# Patient Record
Sex: Female | Born: 1980 | Race: White | Hispanic: Yes | Marital: Married | Smoking: Never smoker
Health system: Southern US, Community
[De-identification: ages and names within clinical notes are randomized; demographics above are authoritative.]

## PROBLEM LIST (undated history)

## (undated) HISTORY — PX: WISDOM TOOTH EXTRACTION: SHX21

---

## 2006-06-17 ENCOUNTER — Other Ambulatory Visit: Admission: RE | Admit: 2006-06-17 | Discharge: 2006-06-17 | Payer: Self-pay | Admitting: Emergency Medicine

## 2007-06-22 ENCOUNTER — Other Ambulatory Visit: Admission: RE | Admit: 2007-06-22 | Discharge: 2007-06-22 | Payer: Self-pay | Admitting: Emergency Medicine

## 2009-05-01 ENCOUNTER — Other Ambulatory Visit: Admission: RE | Admit: 2009-05-01 | Discharge: 2009-05-01 | Payer: Self-pay | Admitting: Family Medicine

## 2010-06-04 ENCOUNTER — Other Ambulatory Visit: Admission: RE | Admit: 2010-06-04 | Discharge: 2010-06-04 | Payer: Self-pay | Admitting: Family Medicine

## 2013-03-10 ENCOUNTER — Other Ambulatory Visit (HOSPITAL_COMMUNITY)
Admission: RE | Admit: 2013-03-10 | Discharge: 2013-03-10 | Disposition: A | Discharge status: Home / Self Care | Payer: BC Managed Care – PPO | Source: Ambulatory Visit | Attending: Family Medicine | Admitting: Family Medicine

## 2013-03-10 ENCOUNTER — Other Ambulatory Visit: Payer: Self-pay | Admitting: Family Medicine

## 2013-03-10 DIAGNOSIS — Z1151 Encounter for screening for human papillomavirus (HPV): Secondary | ICD-10-CM | POA: Insufficient documentation

## 2013-03-10 DIAGNOSIS — Z124 Encounter for screening for malignant neoplasm of cervix: Secondary | ICD-10-CM | POA: Insufficient documentation

## 2014-03-23 ENCOUNTER — Other Ambulatory Visit: Payer: Self-pay | Admitting: Family Medicine

## 2014-03-23 DIAGNOSIS — N6310 Unspecified lump in the right breast, unspecified quadrant: Secondary | ICD-10-CM

## 2014-03-30 ENCOUNTER — Other Ambulatory Visit: Payer: BC Managed Care – PPO

## 2014-04-25 ENCOUNTER — Ambulatory Visit
Admission: RE | Admit: 2014-04-25 | Discharge: 2014-04-25 | Disposition: A | Discharge status: Home / Self Care | Payer: BC Managed Care – PPO | Source: Ambulatory Visit | Attending: Family Medicine | Admitting: Family Medicine

## 2014-04-25 DIAGNOSIS — N6310 Unspecified lump in the right breast, unspecified quadrant: Secondary | ICD-10-CM

## 2014-05-04 ENCOUNTER — Other Ambulatory Visit: Payer: BC Managed Care – PPO

## 2014-05-12 ENCOUNTER — Other Ambulatory Visit: Payer: BC Managed Care – PPO

## 2014-05-18 ENCOUNTER — Ambulatory Visit: Admission: RE | Admit: 2014-05-18 | Payer: BC Managed Care – PPO | Source: Ambulatory Visit

## 2014-05-18 ENCOUNTER — Ambulatory Visit
Admission: RE | Admit: 2014-05-18 | Discharge: 2014-05-18 | Disposition: A | Discharge status: Home / Self Care | Payer: BC Managed Care – PPO | Source: Ambulatory Visit | Attending: Family Medicine | Admitting: Family Medicine

## 2014-05-18 ENCOUNTER — Other Ambulatory Visit: Payer: Self-pay | Admitting: Family Medicine

## 2014-05-18 ENCOUNTER — Encounter (INDEPENDENT_AMBULATORY_CARE_PROVIDER_SITE_OTHER): Payer: Self-pay

## 2014-05-18 DIAGNOSIS — N6310 Unspecified lump in the right breast, unspecified quadrant: Secondary | ICD-10-CM

## 2014-07-30 ENCOUNTER — Emergency Department (HOSPITAL_COMMUNITY): Payer: BC Managed Care – PPO

## 2014-07-30 ENCOUNTER — Encounter (HOSPITAL_COMMUNITY): Payer: Self-pay | Admitting: Emergency Medicine

## 2014-07-30 ENCOUNTER — Emergency Department (HOSPITAL_COMMUNITY)
Admission: EM | Admit: 2014-07-30 | Discharge: 2014-07-30 | Disposition: A | Discharge status: Home / Self Care | Payer: BC Managed Care – PPO | Attending: Emergency Medicine | Admitting: Emergency Medicine

## 2014-07-30 DIAGNOSIS — Y998 Other external cause status: Secondary | ICD-10-CM | POA: Diagnosis not present

## 2014-07-30 DIAGNOSIS — Z23 Encounter for immunization: Secondary | ICD-10-CM | POA: Insufficient documentation

## 2014-07-30 DIAGNOSIS — Y9389 Activity, other specified: Secondary | ICD-10-CM | POA: Insufficient documentation

## 2014-07-30 DIAGNOSIS — S8992XA Unspecified injury of left lower leg, initial encounter: Secondary | ICD-10-CM | POA: Diagnosis present

## 2014-07-30 DIAGNOSIS — W208XXA Other cause of strike by thrown, projected or falling object, initial encounter: Secondary | ICD-10-CM | POA: Diagnosis not present

## 2014-07-30 DIAGNOSIS — Y9289 Other specified places as the place of occurrence of the external cause: Secondary | ICD-10-CM | POA: Insufficient documentation

## 2014-07-30 DIAGNOSIS — T148XXA Other injury of unspecified body region, initial encounter: Secondary | ICD-10-CM

## 2014-07-30 DIAGNOSIS — S8012XA Contusion of left lower leg, initial encounter: Secondary | ICD-10-CM | POA: Insufficient documentation

## 2014-07-30 MED ORDER — TETANUS-DIPHTH-ACELL PERTUSSIS 5-2.5-18.5 LF-MCG/0.5 IM SUSP
0.5000 mL | Freq: Once | INTRAMUSCULAR | Status: AC
  Administered 2014-07-30: 0.5 mL via INTRAMUSCULAR
  Filled 2014-07-30: qty 0.5

## 2014-07-30 MED ORDER — HYDROCODONE-ACETAMINOPHEN 5-325 MG PO TABS
2.0000 | ORAL_TABLET | Freq: Once | ORAL | Status: AC
  Administered 2014-07-30: 2 via ORAL
  Filled 2014-07-30: qty 2

## 2014-07-30 MED ORDER — HYDROCODONE-ACETAMINOPHEN 5-325 MG PO TABS: 1.0000 | ORAL_TABLET | Freq: Four times a day (QID) | ORAL | Status: DC | PRN

## 2014-07-30 NOTE — Discharge Instructions (Signed)

## 2014-07-30 NOTE — ED Notes (Signed)
Pt c/o L leg injury after a trash can full of cement fell on her leg. Pt's leg was stuck under trash can for ten minutes. No bleeding. Pt has not attempted to walk on leg. A&Ox4.

## 2014-07-30 NOTE — ED Provider Notes (Signed)
CSN: [id number]     Arrival date & time 07/30/14  1712 History  This chart was scribed for non-physician practitioner, Madison LowerVrinda Jakyla Reza, FNP,working with Enid SkeensJoshua M Zavitz, MD, by Karle PlumberJennifer Tensley, ED Scribe. This patient was seen in room WTR7/WTR7 and the patient's care was started at 5:26 PM.  Chief Complaint  Patient presents with  . Leg Injury   The history is provided by the patient. No language interpreter was used.    HPI Comments:  Madison Platendrea Spaventa Daniels is a 33 y.o. female who presents to the Emergency Department complaining of a left leg injury that occurred when a trash can full of cement fell on the leg about 1.5 hours ago. She reports associated severe pain. Bearing weight makes the pain worse. Denies alleviating factors. Unsure of last tetanus vaccination. LMP started earlier today. Denies head injury, LOC, numbness, tingling or weakness of the LLE.   History reviewed. No pertinent past medical history. History reviewed. No pertinent past surgical history. No family history on file. History  Substance Use Topics  . Smoking status: Never Smoker   . Smokeless tobacco: Not on file  . Alcohol Use: Yes   OB History    No data available     Review of Systems  Musculoskeletal: Positive for arthralgias.  Skin: Positive for color change and wound.  Neurological: Negative for syncope, weakness and numbness.  All other systems reviewed and are negative.   Allergies  Review of patient's allergies indicates not on file.  Home Medications   Prior to Admission medications   Not on File   Triage Vitals: BP 125/64 mmHg  Pulse 68  Temp(Src) 97.8 F (36.6 C) (Oral)  Resp 16  SpO2 100%  LMP 07/30/2014 (LMP Unknown) Physical Exam  Constitutional: She is oriented to person, place, and time. She appears well-developed and well-nourished.  HENT:  Head: Normocephalic and atraumatic.  Eyes: EOM are normal.  Neck: Normal range of motion.  Cardiovascular: Normal rate and intact  distal pulses.   Pulmonary/Chest: Effort normal.  Musculoskeletal: Normal range of motion.  Neurological: She is alert and oriented to person, place, and time.  Skin: Skin is warm and dry.  Deep abrasion to the left shin. Pt has full rom. Ambulatory without any problem. Pulses intact. Bruising noted to the area  Psychiatric: She has a normal mood and affect. Her behavior is normal.  Nursing note and vitals reviewed.   ED Course  Procedures (including critical care time) DIAGNOSTIC STUDIES: Oxygen Saturation is 100% on RA, normal by my interpretation.   COORDINATION OF CARE: 5:29 PM- Will X-Ray LLE and update tetanus. Will order pain medication. Pt verbalizes understanding and agrees to plan.  Medications - No data to display  Labs Review Labs Reviewed - No data to display  Imaging Review Dg Tibia/fibula Left  07/30/2014   CLINICAL DATA:  Midshaft tibial pain and bruising/abrasion status post trauma.  EXAM: LEFT TIBIA AND FIBULA - 2 VIEW  COMPARISON:  None.  FINDINGS: No displaced fracture. No aggressive osseous lesion. No radiopaque foreign body.  IMPRESSION: Negative.   Electronically Signed   By: Jearld LeschAndrew  DelGaizo M.D.   On: 07/30/2014 17:57     EKG Interpretation None      MDM   Final diagnoses:  Abrasion  Contusion    No bony abnormality noted. Pt is neurovascularly intact. Discussed sign and symptoms of compartment syndrome with pt and return precautions. Pt verbalized understanding  I personally performed the services described in this documentation, which  was scribed in my presence. The recorded information has been reviewed and is accurate.    Madison LowerVrinda Bri Wakeman, NP 07/30/14 1818  Enid SkeensJoshua M Zavitz, MD 07/30/14 2244

## 2015-01-05 ENCOUNTER — Encounter (HOSPITAL_COMMUNITY): Payer: Self-pay | Admitting: General Practice

## 2015-01-06 NOTE — H&P (Signed)
Madison Daniels is an 34 y.o. female with a missed abortion at 7 weeks.  Patient was counseled for expectant, medical and surgical management.  She elected to proceed with surgical management.  The patient has no medical concerns.  Pertinent Gynecological History: Menses: pregnant Bleeding: none Contraception: none DES exposure: unknown Blood transfusions: none Sexually transmitted diseases: no past history Previous GYN Procedures: none  Last mammogram: n/a Date: n/a Last pap: normal Date: 2014 OB History: G1, P0   Menstrual History: Menarche age: n/a Patient's last menstrual period was 11/05/2014.    History reviewed. No pertinent past medical history.  Past Surgical History  Procedure Laterality Date  . Wisdom tooth extraction Bilateral     History reviewed. No pertinent family history.  Social History:  reports that she has never smoked. She has never used smokeless tobacco. She reports that she drinks alcohol. Her drug history is not on file.  Allergies: No Known Allergies  No prescriptions prior to admission    ROS  Height 5\' 3"  (1.6 m), weight 135 lb (61.236 kg), last menstrual period 11/05/2014. Physical Exam  Constitutional: She is oriented to person, place, and time. She appears well-developed and well-nourished.  GI: Soft. There is no rebound and no guarding.  Neurological: She is alert and oriented to person, place, and time.  Skin: Skin is warm and dry.  Psychiatric: She has a normal mood and affect. Her behavior is normal.    No results found for this or any previous visit (from the past 24 hour(s)).  No results found.  Assessment/Plan: 33yo G1 with missed ab at 7 weeks -Check blood type -Patient is counseled for D&C including risk of bleeding, infection, scarring, and damage to surrounding structures.  All questions were answered and the patient wishes to proceed.  Latravis Grine 01/06/2015, 8:16 PM

## 2015-01-10 ENCOUNTER — Encounter (HOSPITAL_COMMUNITY): Payer: Self-pay | Admitting: Anesthesiology

## 2015-01-10 NOTE — Anesthesia Preprocedure Evaluation (Deleted)
Anesthesia Evaluation  Patient identified by MRN, date of birth, ID band Patient awake    Airway        Dental   Pulmonary neg pulmonary ROS,          Cardiovascular negative cardio ROS      Neuro/Psych negative neurological ROS     GI/Hepatic negative GI ROS, Neg liver ROS,   Endo/Other  negative endocrine ROS  Renal/GU negative Renal ROS     Musculoskeletal   Abdominal   Peds  Hematology   Anesthesia Other Findings   Reproductive/Obstetrics Missed AB                             Anesthesia Physical Anesthesia Plan  ASA: I  Anesthesia Plan: MAC   Post-op Pain Management:    Induction: Intravenous  Airway Management Planned: Nasal Cannula  Additional Equipment:   Intra-op Plan:   Post-operative Plan:   Informed Consent: I have reviewed the patients History and Physical, chart, labs and discussed the procedure including the risks, benefits and alternatives for the proposed anesthesia with the patient or authorized representative who has indicated his/her understanding and acceptance.     Plan Discussed with:   Anesthesia Plan Comments:         Anesthesia Quick Evaluation

## 2015-01-12 ENCOUNTER — Ambulatory Visit (HOSPITAL_COMMUNITY)
Admission: RE | Admit: 2015-01-12 | Payer: BLUE CROSS/BLUE SHIELD | Source: Ambulatory Visit | Admitting: Obstetrics & Gynecology

## 2015-01-12 SURGERY — DILATION AND EVACUATION, UTERUS
Anesthesia: Choice

## 2016-04-30 IMAGING — CR DG TIBIA/FIBULA 2V*L*
2 series · 2 of 2 positions shown · non-contrast
Comparison: None.

CLINICAL DATA: Midshaft tibial pain and bruising/abrasion status
post trauma.

EXAM:
LEFT TIBIA AND FIBULA - 2 VIEW

[x tib-fib lat left (1 of 2)]
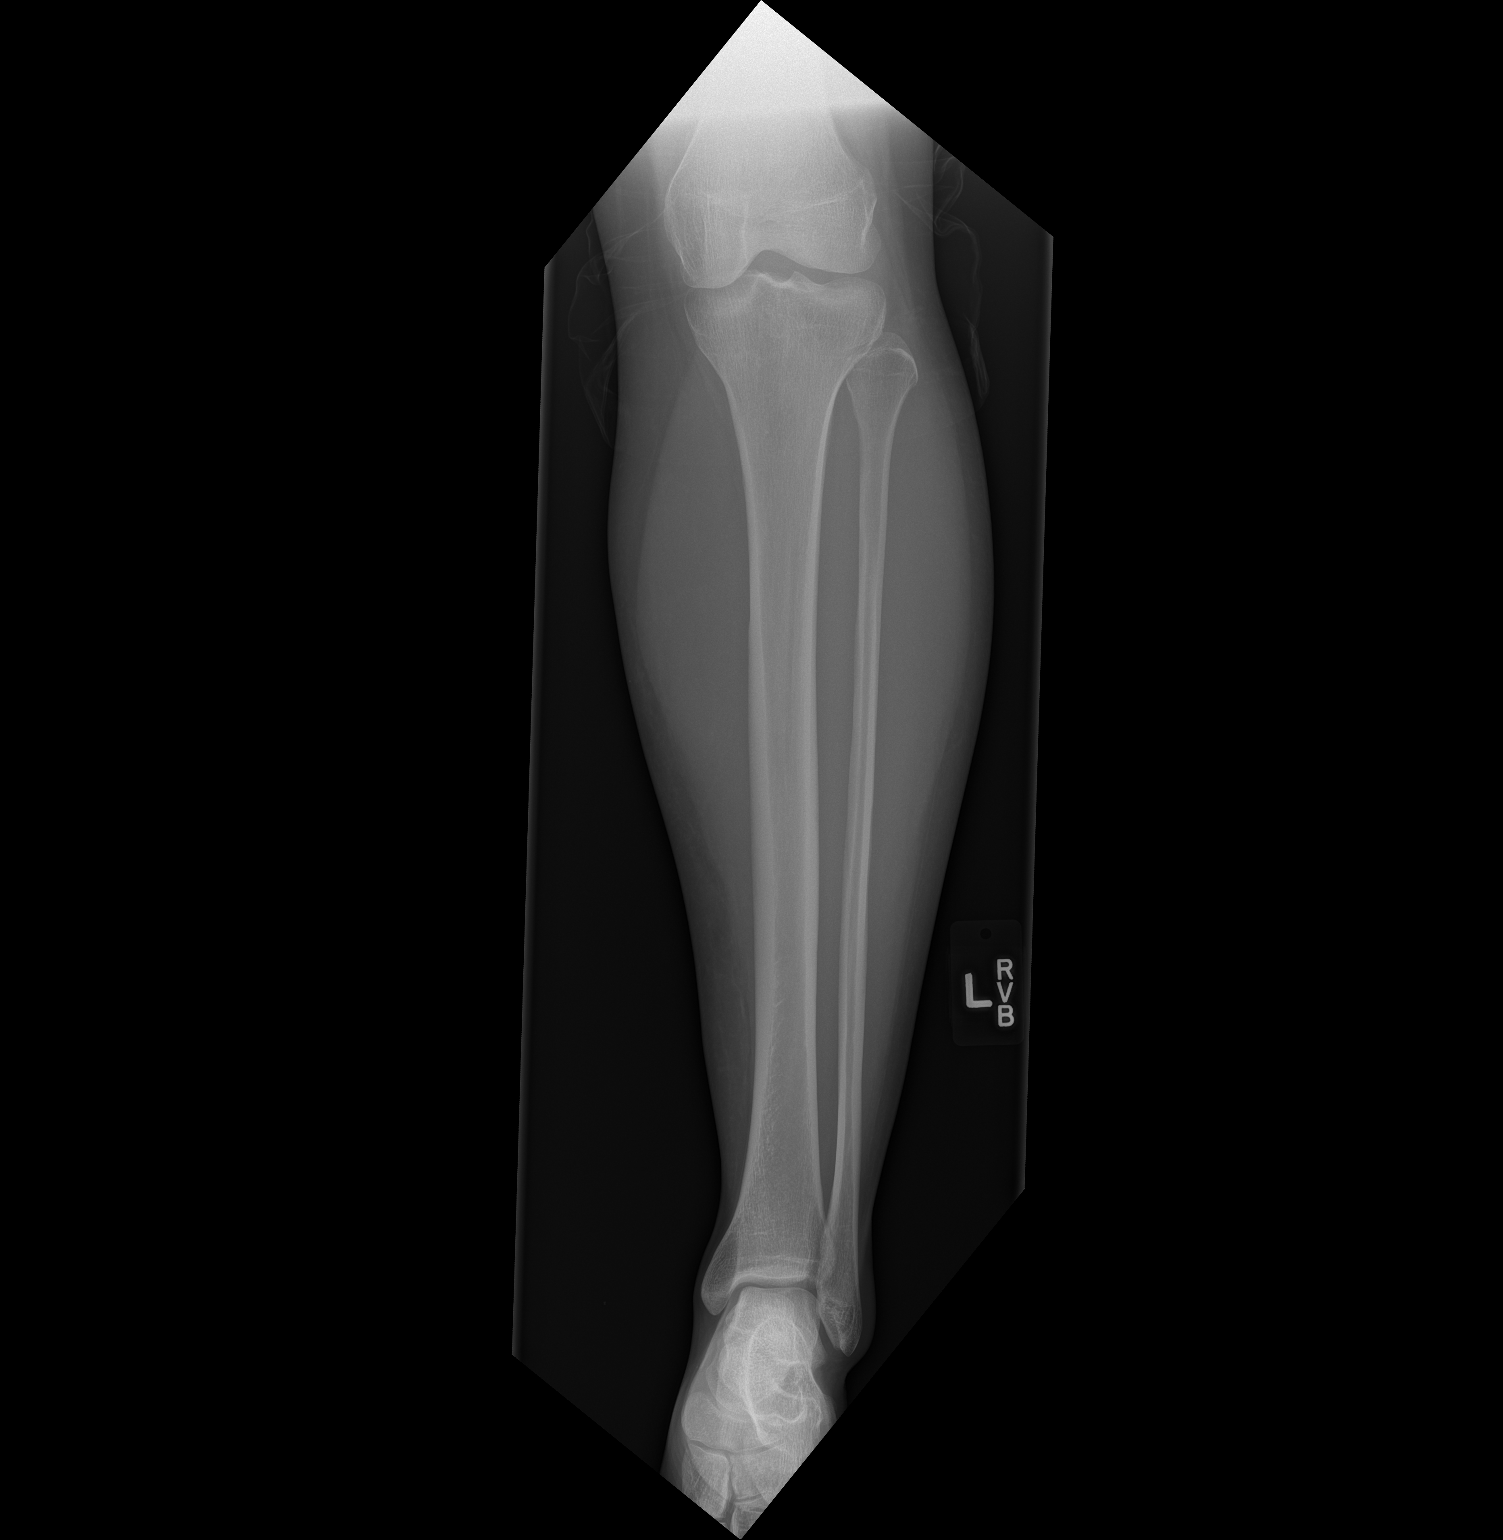

[x tib-fib lat left (2 of 2)]
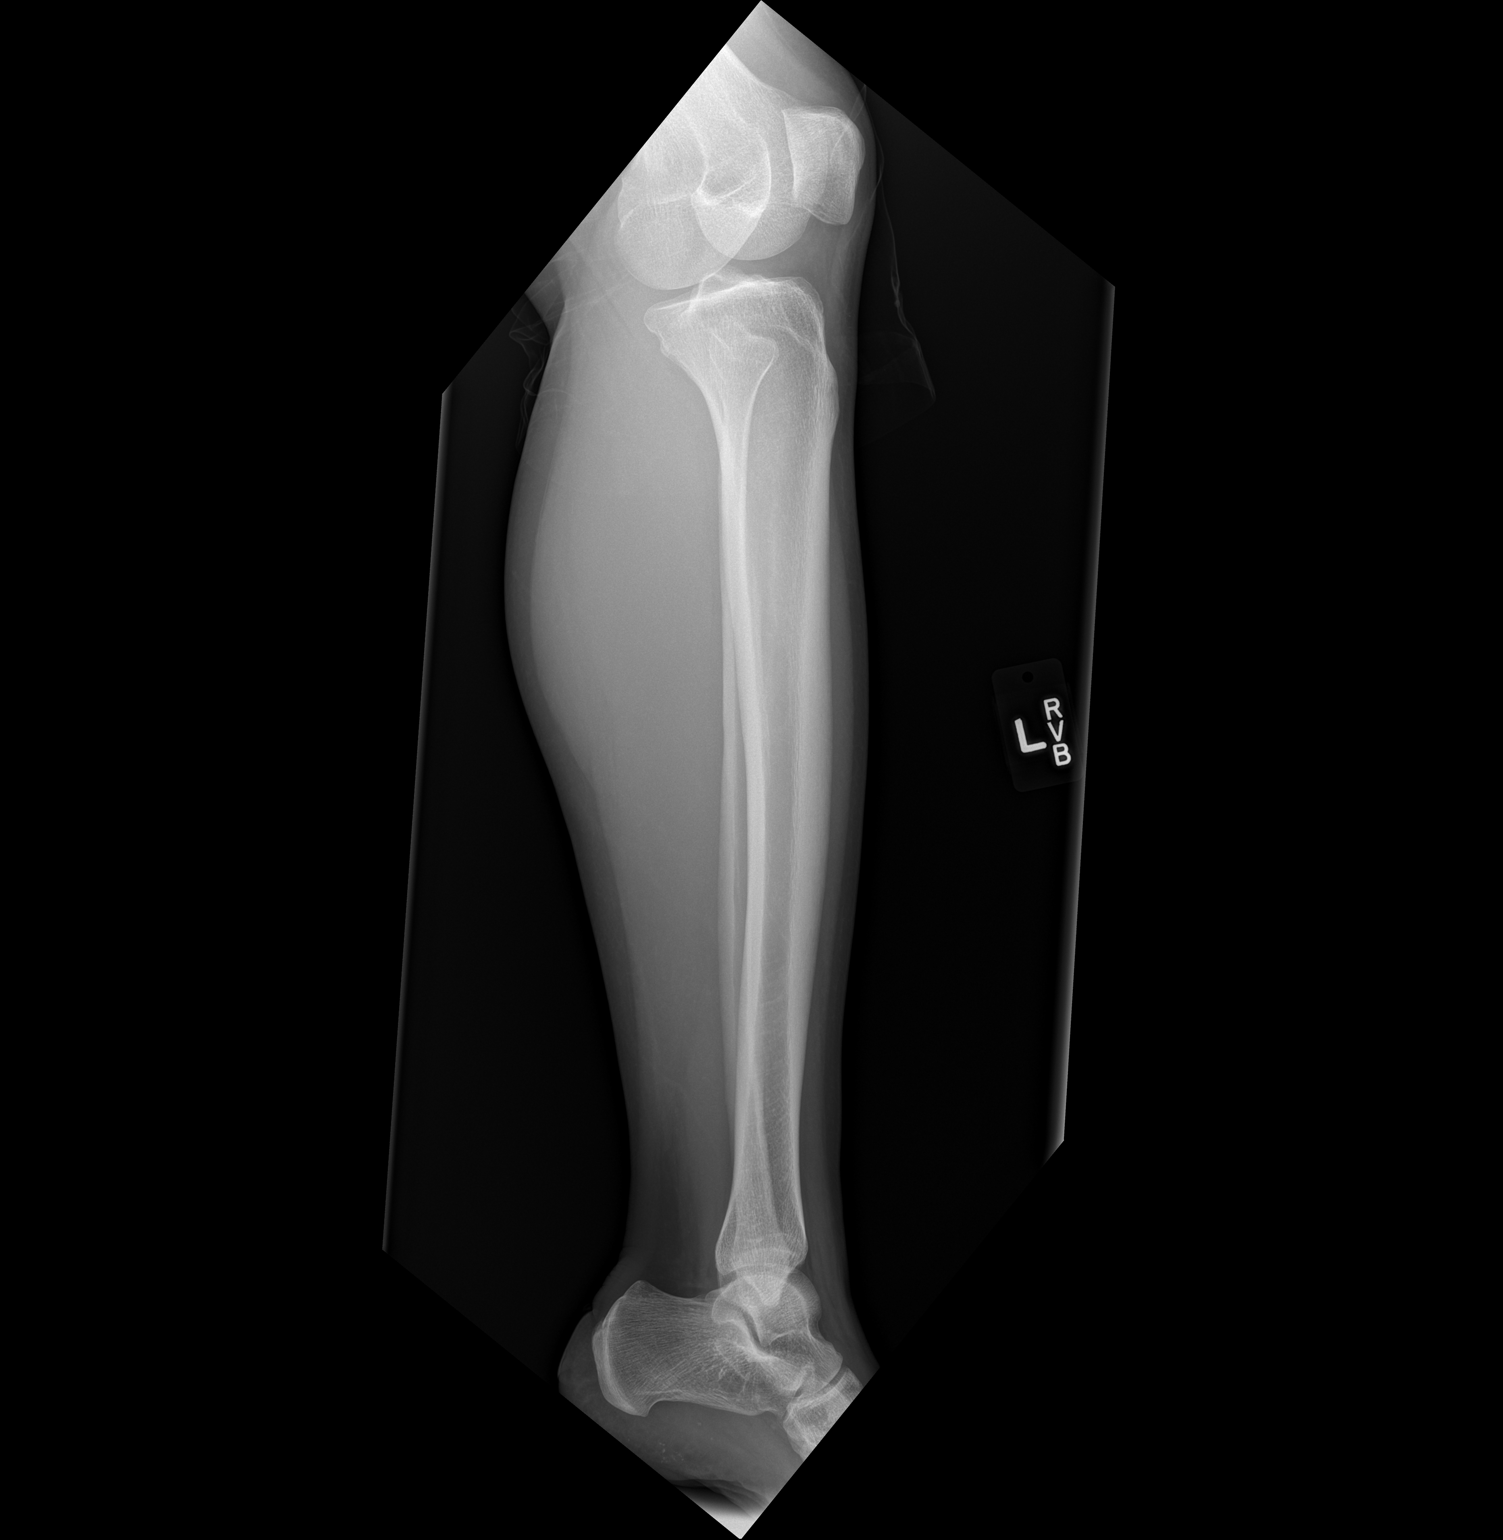

[2 of 2 positions shown; findings below may reference images not displayed]

FINDINGS: No displaced fracture. No aggressive osseous lesion. No radiopaque
foreign body.
IMPRESSION: Negative.

## 2016-12-19 DIAGNOSIS — N912 Amenorrhea, unspecified: Secondary | ICD-10-CM | POA: Diagnosis not present

## 2016-12-19 DIAGNOSIS — N911 Secondary amenorrhea: Secondary | ICD-10-CM | POA: Diagnosis not present

## 2016-12-25 DIAGNOSIS — N912 Amenorrhea, unspecified: Secondary | ICD-10-CM | POA: Diagnosis not present

## 2016-12-25 DIAGNOSIS — N911 Secondary amenorrhea: Secondary | ICD-10-CM | POA: Diagnosis not present

## 2017-01-01 DIAGNOSIS — N911 Secondary amenorrhea: Secondary | ICD-10-CM | POA: Diagnosis not present

## 2017-02-04 DIAGNOSIS — N96 Recurrent pregnancy loss: Secondary | ICD-10-CM | POA: Diagnosis not present

## 2017-09-08 NOTE — L&D Delivery Note (Signed)
Delivery Note At 5:11 PM a viable female was delivered via Vaginal, Spontaneous (Presentation:OA ;  ).  APGAR: , ; weight  .   Placenta statusSPONT>>INTACT: , .  Cord:  with the following complications: .  Cord pH: NOT SENT  Anesthesia:  EPID Episiotomy:NONE  Lacerations:  L PERIURETH +SEC DEG Suture Repair: 3.0 vicryl rapide Est. Blood Loss (mL):  90  Mom to postpartum.  Baby to Couplet care / Skin to Skin.  Madison Daniels Milana ObeyM Luis Sami 07/21/2018, 5:34 PM

## 2017-11-19 DIAGNOSIS — Z3141 Encounter for fertility testing: Secondary | ICD-10-CM | POA: Diagnosis not present

## 2017-12-15 DIAGNOSIS — Z3200 Encounter for pregnancy test, result unknown: Secondary | ICD-10-CM | POA: Diagnosis not present

## 2017-12-17 DIAGNOSIS — Z3200 Encounter for pregnancy test, result unknown: Secondary | ICD-10-CM | POA: Diagnosis not present

## 2017-12-30 DIAGNOSIS — O0901 Supervision of pregnancy with history of infertility, first trimester: Secondary | ICD-10-CM | POA: Diagnosis not present

## 2017-12-30 DIAGNOSIS — Z3A00 Weeks of gestation of pregnancy not specified: Secondary | ICD-10-CM | POA: Diagnosis not present

## 2018-01-12 DIAGNOSIS — Z3685 Encounter for antenatal screening for Streptococcus B: Secondary | ICD-10-CM | POA: Diagnosis not present

## 2018-01-12 DIAGNOSIS — Z13228 Encounter for screening for other metabolic disorders: Secondary | ICD-10-CM | POA: Diagnosis not present

## 2018-01-12 DIAGNOSIS — Z3401 Encounter for supervision of normal first pregnancy, first trimester: Secondary | ICD-10-CM | POA: Diagnosis not present

## 2018-01-12 LAB — OB RESULTS CONSOLE HIV ANTIBODY (ROUTINE TESTING): HIV: NONREACTIVE

## 2018-01-12 LAB — OB RESULTS CONSOLE GC/CHLAMYDIA
Chlamydia: NEGATIVE
GC PROBE AMP, GENITAL: NEGATIVE

## 2018-01-12 LAB — OB RESULTS CONSOLE HEPATITIS B SURFACE ANTIGEN: HEP B S AG: NEGATIVE

## 2018-01-12 LAB — OB RESULTS CONSOLE RUBELLA ANTIBODY, IGM: RUBELLA: IMMUNE

## 2018-01-12 LAB — OB RESULTS CONSOLE RPR: RPR: NONREACTIVE

## 2018-01-19 DIAGNOSIS — Z3491 Encounter for supervision of normal pregnancy, unspecified, first trimester: Secondary | ICD-10-CM | POA: Diagnosis not present

## 2018-01-19 DIAGNOSIS — Z331 Pregnant state, incidental: Secondary | ICD-10-CM | POA: Diagnosis not present

## 2018-01-19 DIAGNOSIS — Z3480 Encounter for supervision of other normal pregnancy, unspecified trimester: Secondary | ICD-10-CM | POA: Diagnosis not present

## 2018-01-19 DIAGNOSIS — Z124 Encounter for screening for malignant neoplasm of cervix: Secondary | ICD-10-CM | POA: Diagnosis not present

## 2018-01-19 DIAGNOSIS — Z113 Encounter for screening for infections with a predominantly sexual mode of transmission: Secondary | ICD-10-CM | POA: Diagnosis not present

## 2018-02-03 DIAGNOSIS — O09511 Supervision of elderly primigravida, first trimester: Secondary | ICD-10-CM | POA: Diagnosis not present

## 2018-02-03 DIAGNOSIS — Z3682 Encounter for antenatal screening for nuchal translucency: Secondary | ICD-10-CM | POA: Diagnosis not present

## 2018-02-03 DIAGNOSIS — Z3A12 12 weeks gestation of pregnancy: Secondary | ICD-10-CM | POA: Diagnosis not present

## 2018-03-04 DIAGNOSIS — N390 Urinary tract infection, site not specified: Secondary | ICD-10-CM | POA: Diagnosis not present

## 2018-03-22 DIAGNOSIS — Z3A18 18 weeks gestation of pregnancy: Secondary | ICD-10-CM | POA: Diagnosis not present

## 2018-03-22 DIAGNOSIS — Z3480 Encounter for supervision of other normal pregnancy, unspecified trimester: Secondary | ICD-10-CM | POA: Diagnosis not present

## 2018-03-22 DIAGNOSIS — Z363 Encounter for antenatal screening for malformations: Secondary | ICD-10-CM | POA: Diagnosis not present

## 2018-05-26 DIAGNOSIS — O4403 Placenta previa specified as without hemorrhage, third trimester: Secondary | ICD-10-CM | POA: Diagnosis not present

## 2018-05-26 DIAGNOSIS — Z3A28 28 weeks gestation of pregnancy: Secondary | ICD-10-CM | POA: Diagnosis not present

## 2018-05-26 DIAGNOSIS — Z3480 Encounter for supervision of other normal pregnancy, unspecified trimester: Secondary | ICD-10-CM | POA: Diagnosis not present

## 2018-05-26 DIAGNOSIS — Z23 Encounter for immunization: Secondary | ICD-10-CM | POA: Diagnosis not present

## 2018-05-26 DIAGNOSIS — O360910 Maternal care for other rhesus isoimmunization, first trimester, not applicable or unspecified: Secondary | ICD-10-CM | POA: Diagnosis not present

## 2018-07-21 ENCOUNTER — Encounter: Payer: Self-pay | Admitting: Advanced Practice Midwife

## 2018-07-21 ENCOUNTER — Inpatient Hospital Stay (HOSPITAL_COMMUNITY): Payer: BLUE CROSS/BLUE SHIELD | Admitting: Anesthesiology

## 2018-07-21 ENCOUNTER — Inpatient Hospital Stay (HOSPITAL_COMMUNITY)
Admission: AD | Admit: 2018-07-21 | Discharge: 2018-07-23 | DRG: 807 | Disposition: A | Discharge status: Home / Self Care | Payer: BLUE CROSS/BLUE SHIELD | Attending: Obstetrics and Gynecology | Admitting: Obstetrics and Gynecology

## 2018-07-21 ENCOUNTER — Other Ambulatory Visit: Payer: Self-pay

## 2018-07-21 DIAGNOSIS — O26893 Other specified pregnancy related conditions, third trimester: Secondary | ICD-10-CM

## 2018-07-21 DIAGNOSIS — Z23 Encounter for immunization: Secondary | ICD-10-CM | POA: Diagnosis not present

## 2018-07-21 DIAGNOSIS — N898 Other specified noninflammatory disorders of vagina: Secondary | ICD-10-CM

## 2018-07-21 DIAGNOSIS — Z3483 Encounter for supervision of other normal pregnancy, third trimester: Secondary | ICD-10-CM | POA: Diagnosis not present

## 2018-07-21 DIAGNOSIS — Z3A36 36 weeks gestation of pregnancy: Secondary | ICD-10-CM | POA: Diagnosis not present

## 2018-07-21 LAB — CBC
HEMATOCRIT: 33.5 % — AB (ref 36.0–46.0)
HEMOGLOBIN: 11 g/dL — AB (ref 12.0–15.0)
MCH: 29.2 pg (ref 26.0–34.0)
MCHC: 32.8 g/dL (ref 30.0–36.0)
MCV: 88.9 fL (ref 80.0–100.0)
Platelets: 174 10*3/uL (ref 150–400)
RBC: 3.77 MIL/uL — ABNORMAL LOW (ref 3.87–5.11)
RDW: 13.9 % (ref 11.5–15.5)
WBC: 10.2 10*3/uL (ref 4.0–10.5)
nRBC: 0 % (ref 0.0–0.2)

## 2018-07-21 LAB — POCT FERN TEST: POCT Fern Test: POSITIVE

## 2018-07-21 LAB — RPR: RPR Ser Ql: NONREACTIVE

## 2018-07-21 MED ORDER — OXYCODONE-ACETAMINOPHEN 5-325 MG PO TABS: 2.0000 | ORAL_TABLET | ORAL | Status: DC | PRN

## 2018-07-21 MED ORDER — LACTATED RINGERS IV SOLN: 500.0000 mL | INTRAVENOUS | Status: DC | PRN

## 2018-07-21 MED ORDER — LACTATED RINGERS IV SOLN
INTRAVENOUS | Status: DC
  Administered 2018-07-21 (×2): via INTRAVENOUS

## 2018-07-21 MED ORDER — ONDANSETRON HCL 4 MG PO TABS: 4.0000 mg | ORAL_TABLET | ORAL | Status: DC | PRN

## 2018-07-21 MED ORDER — FLEET ENEMA 7-19 GM/118ML RE ENEM: 1.0000 | ENEMA | Freq: Every day | RECTAL | Status: DC | PRN

## 2018-07-21 MED ORDER — LACTATED RINGERS IV SOLN
500.0000 mL | Freq: Once | INTRAVENOUS | Status: AC
  Administered 2018-07-21: 500 mL via INTRAVENOUS

## 2018-07-21 MED ORDER — ACETAMINOPHEN 325 MG PO TABS: 650.0000 mg | ORAL_TABLET | ORAL | Status: DC | PRN

## 2018-07-21 MED ORDER — OXYTOCIN 40 UNITS IN LACTATED RINGERS INFUSION - SIMPLE MED
1.0000 m[IU]/min | INTRAVENOUS | Status: DC
  Administered 2018-07-21: 2 m[IU]/min via INTRAVENOUS

## 2018-07-21 MED ORDER — TERBUTALINE SULFATE 1 MG/ML IJ SOLN: 0.2500 mg | Freq: Once | INTRAMUSCULAR | Status: DC | PRN

## 2018-07-21 MED ORDER — PRENATAL MULTIVITAMIN CH
1.0000 | ORAL_TABLET | Freq: Every day | ORAL | Status: DC
  Administered 2018-07-22 – 2018-07-23 (×2): 1 via ORAL
  Filled 2018-07-21 (×2): qty 1

## 2018-07-21 MED ORDER — WITCH HAZEL-GLYCERIN EX PADS: 1.0000 "application " | MEDICATED_PAD | CUTANEOUS | Status: DC | PRN

## 2018-07-21 MED ORDER — BUTORPHANOL TARTRATE 1 MG/ML IJ SOLN
1.0000 mg | Freq: Once | INTRAMUSCULAR | Status: AC
  Administered 2018-07-21: 1 mg via INTRAVENOUS
  Filled 2018-07-21: qty 1

## 2018-07-21 MED ORDER — SENNOSIDES-DOCUSATE SODIUM 8.6-50 MG PO TABS
2.0000 | ORAL_TABLET | ORAL | Status: DC
  Administered 2018-07-21 – 2018-07-22 (×2): 2 via ORAL
  Filled 2018-07-21 (×2): qty 2

## 2018-07-21 MED ORDER — DIPHENHYDRAMINE HCL 50 MG/ML IJ SOLN: 12.5000 mg | INTRAMUSCULAR | Status: DC | PRN

## 2018-07-21 MED ORDER — DIBUCAINE 1 % RE OINT: 1.0000 "application " | TOPICAL_OINTMENT | RECTAL | Status: DC | PRN

## 2018-07-21 MED ORDER — OXYCODONE-ACETAMINOPHEN 5-325 MG PO TABS: 1.0000 | ORAL_TABLET | ORAL | Status: DC | PRN

## 2018-07-21 MED ORDER — OXYTOCIN BOLUS FROM INFUSION
500.0000 mL | Freq: Once | INTRAVENOUS | Status: AC
  Administered 2018-07-21: 500 mL via INTRAVENOUS

## 2018-07-21 MED ORDER — SOD CITRATE-CITRIC ACID 500-334 MG/5ML PO SOLN: 30.0000 mL | ORAL | Status: DC | PRN

## 2018-07-21 MED ORDER — TETANUS-DIPHTH-ACELL PERTUSSIS 5-2.5-18.5 LF-MCG/0.5 IM SUSP: 0.5000 mL | Freq: Once | INTRAMUSCULAR | Status: DC

## 2018-07-21 MED ORDER — EPHEDRINE 5 MG/ML INJ
10.0000 mg | INTRAVENOUS | Status: DC | PRN
  Filled 2018-07-21: qty 2

## 2018-07-21 MED ORDER — LIDOCAINE HCL (PF) 1 % IJ SOLN
30.0000 mL | INTRAMUSCULAR | Status: AC | PRN
  Administered 2018-07-21: 30 mL via SUBCUTANEOUS
  Filled 2018-07-21: qty 30

## 2018-07-21 MED ORDER — PHENYLEPHRINE 40 MCG/ML (10ML) SYRINGE FOR IV PUSH (FOR BLOOD PRESSURE SUPPORT)
80.0000 ug | PREFILLED_SYRINGE | INTRAVENOUS | Status: DC | PRN
  Filled 2018-07-21: qty 5

## 2018-07-21 MED ORDER — IBUPROFEN 800 MG PO TABS
800.0000 mg | ORAL_TABLET | Freq: Three times a day (TID) | ORAL | Status: DC | PRN
  Administered 2018-07-21 – 2018-07-23 (×5): 800 mg via ORAL
  Filled 2018-07-21 (×5): qty 1

## 2018-07-21 MED ORDER — LIDOCAINE HCL URETHRAL/MUCOSAL 2 % EX GEL
1.0000 "application " | Freq: Once | CUTANEOUS | Status: AC
  Administered 2018-07-21: 1 via TOPICAL
  Filled 2018-07-21: qty 5

## 2018-07-21 MED ORDER — SIMETHICONE 80 MG PO CHEW: 80.0000 mg | CHEWABLE_TABLET | ORAL | Status: DC | PRN

## 2018-07-21 MED ORDER — ACETAMINOPHEN 325 MG PO TABS
650.0000 mg | ORAL_TABLET | ORAL | Status: DC | PRN
  Administered 2018-07-22: 650 mg via ORAL
  Filled 2018-07-21: qty 2

## 2018-07-21 MED ORDER — SODIUM CHLORIDE 0.9 % IV SOLN
5.0000 10*6.[IU] | Freq: Once | INTRAVENOUS | Status: AC
  Administered 2018-07-21: 5 10*6.[IU] via INTRAVENOUS
  Filled 2018-07-21: qty 5

## 2018-07-21 MED ORDER — PHENYLEPHRINE 40 MCG/ML (10ML) SYRINGE FOR IV PUSH (FOR BLOOD PRESSURE SUPPORT)
80.0000 ug | PREFILLED_SYRINGE | INTRAVENOUS | Status: DC | PRN
  Administered 2018-07-21: 80 ug via INTRAVENOUS
  Filled 2018-07-21: qty 10
  Filled 2018-07-21: qty 5

## 2018-07-21 MED ORDER — BENZOCAINE-MENTHOL 20-0.5 % EX AERO
1.0000 "application " | INHALATION_SPRAY | CUTANEOUS | Status: DC | PRN
  Administered 2018-07-21: 1 via TOPICAL
  Filled 2018-07-21: qty 56

## 2018-07-21 MED ORDER — MEASLES, MUMPS & RUBELLA VAC IJ SOLR: 0.5000 mL | Freq: Once | INTRAMUSCULAR | Status: DC

## 2018-07-21 MED ORDER — BISACODYL 10 MG RE SUPP: 10.0000 mg | Freq: Every day | RECTAL | Status: DC | PRN

## 2018-07-21 MED ORDER — FENTANYL 2.5 MCG/ML BUPIVACAINE 1/10 % EPIDURAL INFUSION (WH - ANES)
14.0000 mL/h | INTRAMUSCULAR | Status: DC | PRN
  Administered 2018-07-21: 14 mL/h via EPIDURAL
  Filled 2018-07-21: qty 100

## 2018-07-21 MED ORDER — PENICILLIN G 3 MILLION UNITS IVPB - SIMPLE MED
3.0000 10*6.[IU] | INTRAVENOUS | Status: DC
  Administered 2018-07-21 (×2): 3 10*6.[IU] via INTRAVENOUS
  Filled 2018-07-21 (×2): qty 100

## 2018-07-21 MED ORDER — ONDANSETRON HCL 4 MG/2ML IJ SOLN: 4.0000 mg | INTRAMUSCULAR | Status: DC | PRN

## 2018-07-21 MED ORDER — DIPHENHYDRAMINE HCL 25 MG PO CAPS: 25.0000 mg | ORAL_CAPSULE | Freq: Four times a day (QID) | ORAL | Status: DC | PRN

## 2018-07-21 MED ORDER — ZOLPIDEM TARTRATE 5 MG PO TABS: 5.0000 mg | ORAL_TABLET | Freq: Every evening | ORAL | Status: DC | PRN

## 2018-07-21 MED ORDER — TERBUTALINE SULFATE 1 MG/ML IJ SOLN
0.2500 mg | Freq: Once | INTRAMUSCULAR | Status: DC | PRN
  Filled 2018-07-21: qty 1

## 2018-07-21 MED ORDER — COCONUT OIL OIL
1.0000 "application " | TOPICAL_OIL | Status: DC | PRN
  Administered 2018-07-22: 1 via TOPICAL
  Filled 2018-07-21: qty 120

## 2018-07-21 MED ORDER — OXYTOCIN 40 UNITS IN LACTATED RINGERS INFUSION - SIMPLE MED: 2.5000 [IU]/h | INTRAVENOUS | Status: DC

## 2018-07-21 MED ORDER — OXYTOCIN 40 UNITS IN LACTATED RINGERS INFUSION - SIMPLE MED
1.0000 m[IU]/min | INTRAVENOUS | Status: DC
  Filled 2018-07-21: qty 1000

## 2018-07-21 MED ORDER — ONDANSETRON HCL 4 MG/2ML IJ SOLN: 4.0000 mg | Freq: Four times a day (QID) | INTRAMUSCULAR | Status: DC | PRN

## 2018-07-21 MED ORDER — LIDOCAINE-EPINEPHRINE (PF) 2 %-1:200000 IJ SOLN
INTRAMUSCULAR | Status: DC | PRN
  Administered 2018-07-21: 3 mL via EPIDURAL
  Administered 2018-07-21: 4 mL via EPIDURAL

## 2018-07-21 NOTE — Progress Notes (Signed)
now 6/90/-1, FHR cat I

## 2018-07-21 NOTE — MAU Provider Note (Signed)
  S: Ms. Madison Daniels is a 37 y.o. G1P0 at 432w1d  who presents to MAU today complaining contractions q 5-10 minutes. She denies vaginal bleeding. She endorses LOF. She reports normal fetal movement.    O: BP 128/79 (BP Location: Right Arm)   Pulse 93   Temp 98.3 F (36.8 C) (Oral)   Resp 18   Ht 5\' 3"  (1.6 m)   Wt 81.2 kg   SpO2 99%   BMI 31.72 kg/m  GENERAL: Well-developed, well-nourished female in no acute distress.  HEAD: Normocephalic, atraumatic.  CHEST: Normal effort of breathing, regular heart rate ABDOMEN: Soft, nontender, gravid  Cervical exam:    Fetal Monitoring: Baseline: 140 Variability: average Accelerations: present Decelerations: none Contractions: Irregular every 5-8 minutes  Grossly ruptured  A: SIUP at 752w1d  Vaginal discharge in pregnancy  P: Admit to [facility] Suites Routine orders MD to follow   Aviva SignsWilliams, Elaf Clauson L, CNM 07/21/2018 4:37 AM

## 2018-07-21 NOTE — Anesthesia Procedure Notes (Signed)
Epidural Patient location during procedure: OB Start time: 07/21/2018 1:00 PM End time: 07/21/2018 1:15 PM  Staffing Anesthesiologist: Elmer PickerWoodrum, Elihue Ebert L, MD Performed: anesthesiologist   Preanesthetic Checklist Completed: patient identified, pre-op evaluation, timeout performed, IV checked, risks and benefits discussed and monitors and equipment checked  Epidural Patient position: sitting Prep: site prepped and draped and DuraPrep Patient monitoring: continuous pulse ox, blood pressure, heart rate and cardiac monitor Approach: midline Location: L3-L4 Injection technique: LOR air  Needle:  Needle type: Tuohy  Needle gauge: 17 G Needle length: 9 cm Needle insertion depth: 5 cm Catheter type: closed end flexible Catheter size: 19 Gauge Catheter at skin depth: 10 cm Test dose: negative  Assessment Sensory level: T8 Events: blood not aspirated, injection not painful, no injection resistance, negative IV test and no paresthesia  Additional Notes Patient identified. Risks/Benefits/Options discussed with patient including but not limited to bleeding, infection, nerve damage, paralysis, failed block, incomplete pain control, headache, blood pressure changes, nausea, vomiting, reactions to medication both or allergic, itching and postpartum back pain. Confirmed with bedside nurse the patient's most recent platelet count. Confirmed with patient that they are not currently taking any anticoagulation, have any bleeding history or any family history of bleeding disorders. Patient expressed understanding and wished to proceed. All questions were answered. Sterile technique was used throughout the entire procedure. Please see nursing notes for vital signs. Test dose was given through epidural catheter and negative prior to continuing to dose epidural or start infusion. Warning signs of high block given to the patient including shortness of breath, tingling/numbness in hands, complete motor block,  or any concerning symptoms with instructions to call for help. Patient was given instructions on fall risk and not to get out of bed. All questions and concerns addressed with instructions to call with any issues or inadequate analgesia.  Reason for block:procedure for pain

## 2018-07-21 NOTE — MAU Note (Signed)
Pt woke up at 0130 and felt some fluid leaking. Pink colored. Has also felt some discomfort with urination.  Denies bleeding. +FM

## 2018-07-21 NOTE — Anesthesia Preprocedure Evaluation (Signed)
Anesthesia Evaluation  Patient identified by MRN, date of birth, ID band Patient awake    Reviewed: Allergy & Precautions, NPO status , Patient's Chart, lab work & pertinent test results  Airway Mallampati: II  TM Distance: >3 FB Neck ROM: Full    Dental no notable dental hx.    Pulmonary neg pulmonary ROS,    Pulmonary exam normal breath sounds clear to auscultation       Cardiovascular negative cardio ROS Normal cardiovascular exam Rhythm:Regular Rate:Normal     Neuro/Psych negative neurological ROS  negative psych ROS   GI/Hepatic negative GI ROS, Neg liver ROS,   Endo/Other  negative endocrine ROS  Renal/GU negative Renal ROS  negative genitourinary   Musculoskeletal negative musculoskeletal ROS (+)   Abdominal   Peds negative pediatric ROS (+)  Hematology negative hematology ROS (+)   Anesthesia Other Findings   Reproductive/Obstetrics (+) Pregnancy                             Anesthesia Physical Anesthesia Plan  ASA: II  Anesthesia Plan: Epidural   Post-op Pain Management:    Induction:   PONV Risk Score and Plan: Treatment may vary due to age or medical condition  Airway Management Planned: Natural Airway  Additional Equipment:   Intra-op Plan:   Post-operative Plan:   Informed Consent: I have reviewed the patients History and Physical, chart, labs and discussed the procedure including the risks, benefits and alternatives for the proposed anesthesia with the patient or authorized representative who has indicated his/her understanding and acceptance.       Plan Discussed with: Anesthesiologist  Anesthesia Plan Comments: (Patient identified. Risks, benefits, options discussed with patient including but not limited to bleeding, infection, nerve damage, paralysis, failed block, incomplete pain control, headache, blood pressure changes, nausea, vomiting, reactions to  medication, itching, and post partum back pain. Confirmed with bedside nurse the patient's most recent platelet count. Confirmed with the patient that they are not taking any anticoagulation, have any bleeding history or any family history of bleeding disorders. Patient expressed understanding and wishes to proceed. All questions were answered. )        Anesthesia Quick Evaluation  

## 2018-07-21 NOTE — H&P (Signed)
Madison Daniels is a 37 y.o. female presenting for SROM + early labor. OB History    Gravida  1   Para      Term      Preterm      AB      Living        SAB      TAB      Ectopic      Multiple      Live Births             History reviewed. No pertinent past medical history. Past Surgical History:  Procedure Laterality Date  . WISDOM TOOTH EXTRACTION Bilateral    Family History: family history is not on file. Social History:  reports that she has never smoked. She has never used smokeless tobacco. She reports that she drank alcohol. She reports that she has current or past drug history.     Maternal Diabetes: No Genetic Screening: Normal Maternal Ultrasounds/Referrals: Normal Fetal Ultrasounds or other Referrals:  None Maternal Substance Abuse:  No Significant Maternal Medications:  None Significant Maternal Lab Results:  None Other Comments:  None  ROS History Dilation: 1.5 Effacement (%): 50 Station: -3 Exam by:: Tlytle RN  Blood pressure 110/62, pulse 77, temperature 98.1 F (36.7 C), temperature source Oral, resp. rate 16, height 5\' 3"  (1.6 Daniels), weight 81.2 kg, SpO2 99 %. Exam Physical Exam  Constitutional: She is oriented to person, place, and time. She appears well-developed and well-nourished.  HENT:  Head: Normocephalic and atraumatic.  Neck: Normal range of motion. Neck supple.  Cardiovascular: Normal rate and regular rhythm.  Respiratory: Effort normal and breath sounds normal.  GI:  Term FH, FHR 134  Genitourinary:  Genitourinary Comments: Clear af/vtx  Musculoskeletal: Normal range of motion.  Neurological: She is alert and oriented to person, place, and time.    Prenatal labs: ABO, Rh: --/--/AB NEG (11/13 0528) Antibody: POS (11/13 0528) Rubella: Immune (05/07 0000) RPR: Nonreactive (05/07 0000)  HBsAg: Negative (05/07 0000)  HIV: Non-reactive (05/07 0000)  GBS:     Assessment/Plan: 792w1d SROM Rapid GBS>>>ABX   Madison Daniels  Madison Daniels 07/21/2018, 7:35 AM

## 2018-07-21 NOTE — Anesthesia Pain Management Evaluation Note (Signed)
  CRNA Pain Management Visit Note  Patient: Madison Daniels, 37 y.o., female  "Hello I am a [facility]. We have an anesthesia team available at all times to provide care throughout the hospital, including epidural management and anesthesia for C-section. I don't know your plan for the delivery whether it a natural birth, water birth, IV sedation, nitrous supplementation, doula or epidural, but we want to meet your pain goals."   1.Was your pain managed to your expectations on prior hospitalizations?   No prior hospitalizations  2.What is your expectation for pain management during this hospitalization?     Epidural  3.How can we help you reach that goal? *support**  Record the patient's initial score and the patient's pain goal.   Pain: 2  Pain Goal: 6 [facility] wants you to be able to say your pain was always managed very well.  Trellis PaganiniBREWER,Harol Shabazz N 07/21/2018

## 2018-07-22 ENCOUNTER — Encounter (HOSPITAL_COMMUNITY): Payer: Self-pay

## 2018-07-22 LAB — CBC
HCT: 31.1 % — ABNORMAL LOW (ref 36.0–46.0)
Hemoglobin: 10.6 g/dL — ABNORMAL LOW (ref 12.0–15.0)
MCH: 30.3 pg (ref 26.0–34.0)
MCHC: 34.1 g/dL (ref 30.0–36.0)
MCV: 88.9 fL (ref 80.0–100.0)
NRBC: 0 % (ref 0.0–0.2)
PLATELETS: 163 10*3/uL (ref 150–400)
RBC: 3.5 MIL/uL — AB (ref 3.87–5.11)
RDW: 14.4 % (ref 11.5–15.5)
WBC: 14.6 10*3/uL — ABNORMAL HIGH (ref 4.0–10.5)

## 2018-07-22 MED ORDER — RHO D IMMUNE GLOBULIN 1500 UNIT/2ML IJ SOSY
300.0000 ug | PREFILLED_SYRINGE | Freq: Once | INTRAMUSCULAR | Status: AC
  Administered 2018-07-22: 300 ug via INTRAVENOUS
  Filled 2018-07-22: qty 2

## 2018-07-22 NOTE — Lactation Note (Signed)
This note was copied from a baby's chart. Lactation Consultation Note  Patient Name: Madison Daniels Needsndrea Dejoy ZOXWR'UToday's Date: 07/22/2018 Reason for consult: Initial assessment;Late-preterm 34-36.6wks;1st time breastfeeding P1, LPTI  BF concerns: LPTI, not latching currently, per parents sleeping 4 to 5 hours. Per mom, has Spectra 2 DEBP at home. Per mom, 1 void and 1 stool. Mom attempted latch infant using the football hold, infant lick and taste but would not suckle or latch to breast at this time. Mom demonstrated hand expression and infant was given 5 ml of colostrum by spoon. Mom shown how to use DEBP & how to disassemble, clean, & reassemble parts by nurse. LC discussed LPT infant behaviors. LC discussed I & O. Reviewed Baby & Me book's Breastfeeding Basics.  Mom made aware of O/P services, breastfeeding support groups, community resources, and our phone # for post-discharge questions.  Mom's plan: 1. BF according hunger cues, 8 to 12 times within 24 hours. 2. LC suggested not go past 3 hours without feeding infant. 3. LC suggested to do STS as much as possible. 4. Mom pump every 3 hours or hand express and give infant back EBM.  Maternal Data Formula Feeding for Exclusion: No Has patient been taught Hand Expression?: Yes Does the patient have breastfeeding experience prior to this delivery?: No  Feeding Feeding Type: Breast Fed  LATCH Score Latch: Too sleepy or reluctant, no latch achieved, no sucking elicited.  Audible Swallowing: None  Type of Nipple: Everted at rest and after stimulation  Comfort (Breast/Nipple): Soft / non-tender  Hold (Positioning): Assistance needed to correctly position infant at breast and maintain latch.  LATCH Score: 5  Interventions Interventions: Breast feeding basics reviewed;Assisted with latch;Skin to skin;Adjust position;Support pillows;Hand express;DEBP;Hand pump  Lactation Tools Discussed/Used WIC Program: No Pump Review: Setup, frequency,  and cleaning Initiated by:: Nurse Date initiated:: 07/22/18   Consult Status Consult Status: Follow-up Date: 07/22/18 Follow-up type: In-patient    Danelle EarthlyRobin Maylen Waltermire 07/22/2018, 6:47 AM

## 2018-07-22 NOTE — Progress Notes (Signed)
Post Partum Day 1 Subjective: no complaints, up ad lib, voiding, tolerating PO and + flatus  Objective: Blood pressure 110/74, pulse 80, temperature 98.9 F (37.2 C), temperature source Oral, resp. rate 18, height 5\' 3"  (1.6 m), weight 81.2 kg, SpO2 99 %, unknown if currently breastfeeding.  Physical Exam:  General: alert and no distress Lochia: appropriate Uterine Fundus: firm Incision: healing well DVT Evaluation: No evidence of DVT seen on physical exam.  Recent Labs    07/21/18 0528 07/22/18 0632  HGB 11.0* 10.6*  HCT 33.5* 31.1*    Assessment/Plan: Plan for discharge tomorrow   LOS: 1 day   Roselle LocusJames E Elliott Lasecki II 07/22/2018, 7:26 AM

## 2018-07-22 NOTE — Lactation Note (Signed)
This note was copied from a baby's chart. Lactation Consultation Note  Patient Name: Madison Daniels GYLUD'A Date: 07/22/2018 Reason for consult: Follow-up assessment;Primapara;1st time breastfeeding;Late-preterm 34-36.6wks;Infant weight loss  19 hours old LPI at 3% weight loss, baby got circumcised this morning and not showing feeding cues, he was asleep. Mom has been pumping every 3 hours and getting some drops of colostrum. Explained to mom about LPI policy and reviewed the "Caring for your Late preterm baby handout". Mom understands that if the amounts for supplementation are not met by pumping or hand expression, baby will have to get supplemented, the formula of choice for supplementation will be Similac 20 calorie and the instrument for supplementation will be a curve tip syringe; LC also left medicine cups to measure volume. Mom also asked if nipples could be used, explained to mom it's possible to have them as a back up option but that they could interfere with feedings at the breast.   Feeding plan:  1. Encouraged mom to feed baby at the breast STS every 3 hours 2. Limit feedings at the breast to no more than 30 minutes per LPI 3. Mom will start supplementing baby on the next feeding once he shows feeding cues; using her EBM first and then Similac 20 to complete the volumes required for supplementation according the baby's age. 4. Mom aware that she needs to supplement every 3 hours and to call her RN for a bottle at every feeding  Mom understands that formula supplementation is just temporary until baby gets more "practice at the breast" and her milk comes in full volume. Mom reported all questions and concerns were answered, she's aware of [facility] services and will call PRN.  Maternal Data    Feeding   Interventions Interventions: Breast feeding basics reviewed;Skin to skin  Lactation Tools Discussed/Used     Consult Status Consult Status: Follow-up Date: 07/23/18 Follow-up  type: In-patient    Madison Daniels Madison Daniels 07/22/2018, 12:40 PM

## 2018-07-22 NOTE — Anesthesia Postprocedure Evaluation (Signed)
Anesthesia Post Note  Patient: Madison Daniels  Procedure(s) Performed: AN AD HOC LABOR EPIDURAL     Patient location during evaluation: Mother Baby Anesthesia Type: Spinal Level of consciousness: awake Pain management: pain level controlled Vital Signs Assessment: post-procedure vital signs reviewed and stable Respiratory status: spontaneous breathing Cardiovascular status: stable Postop Assessment: no headache, no backache and spinal receding Anesthetic complications: no    Last Vitals:  Vitals:   07/22/18 0537 07/22/18 1355  BP: 110/74 115/68  Pulse: 80 90  Resp:  16  Temp:  36.9 C  SpO2: 99%     Last Pain:  Vitals:   07/22/18 1355  TempSrc: Oral  PainSc:    Pain Goal:                 [name]

## 2018-07-22 NOTE — Addendum Note (Signed)
Addendum  created 07/22/18 1642 by Yariana Hoaglund L, CRNA   Sign clinical note    

## 2018-07-22 NOTE — Anesthesia Postprocedure Evaluation (Signed)
Anesthesia Post Note  Patient: Madison Daniels  Procedure(s) Performed: AN AD HOC LABOR EPIDURAL     Patient location during evaluation: Mother Baby Anesthesia Type: Epidural Level of consciousness: awake, awake and alert and oriented Pain management: satisfactory to patient Vital Signs Assessment: post-procedure vital signs reviewed and stable Respiratory status: spontaneous breathing and respiratory function stable Cardiovascular status: blood pressure returned to baseline Postop Assessment: no headache, no backache, epidural receding, patient able to bend at knees, no apparent nausea or vomiting, adequate PO intake and able to ambulate Anesthetic complications: no    Last Vitals:  Vitals:   07/21/18 2013 07/22/18 0537  BP: 116/61 110/74  Pulse: 74 80  Resp: 18   Temp: 37.2 C   SpO2:  99%    Last Pain:  Vitals:   07/21/18 2350  TempSrc:   PainSc: 0-No pain   Pain Goal:                 Cleda ClarksBrowder, Sarahanne Novakowski R

## 2018-07-23 LAB — RH IG WORKUP (INCLUDES ABO/RH)
ABO/RH(D): AB NEG
FETAL SCREEN: NEGATIVE
GESTATIONAL AGE(WKS): 36
Unit division: 0

## 2018-07-23 NOTE — Lactation Note (Signed)
This note was copied from a baby's chart. Lactation Consultation Note  Patient Name: Madison Daniels Needsndrea Tallie ZOXWR'UToday's Date: 07/23/2018 Reason for consult: Follow-up assessment;Primapara;1st time breastfeeding;Infant weight loss;Late-preterm 34-36.6wks(7 %  weight loss )  Baby is 40 hours old.  As LC entered the room, mom holding baby trying to latch.  Baby was fussy and LC offered to change diaper, stool.  LC assisted to latch on the right breast and added 34F SNS with latch and baby took 8 ml and  Fed 12 mins . ( see doc flow sheets )  Sore nipple and engorgement prevention and tx reviewed.  Mom has a hand pump and her DEBP Spectra at home.  LC reviewed importance of STS feedings until baby is back to birth weight and gaining  Well. Discussed LPT potential feeding behavior.  LC offered to request and LC O/P appt. And mom receptive.  LC placed a request for next week.  Mother informed of post-discharge support and given phone number to the lactation department, including services for phone call assistance; out-patient appointments; and breastfeeding support group. List of other breastfeeding resources in the community given in the handout. Encouraged mother to call for problems or concerns related to breastfeeding.   Maternal Data Has patient been taught Hand Expression?: Yes  Feeding Feeding Type: Breast Milk with Formula added  LATCH Score Latch: Grasps breast easily, tongue down, lips flanged, rhythmical sucking.  Audible Swallowing: Spontaneous and intermittent  Type of Nipple: Everted at rest and after stimulation  Comfort (Breast/Nipple): Soft / non-tender  Hold (Positioning): Assistance needed to correctly position infant at breast and maintain latch.  LATCH Score: 9  Interventions Interventions: Breast feeding basics reviewed;Assisted with latch;Skin to skin;Hand express;Breast massage;Reverse pressure;Breast compression;Adjust position;Support pillows;Position  options  Lactation Tools Discussed/Used Tools: Shells;Pump;Flanges;Coconut oil;34F feeding tube / Syringe Flange Size: 24 Shell Type: Inverted Breast pump type: Manual;Double-Electric Breast Pump Pump Review: Milk Storage;Setup, frequency, and cleaning   Consult Status Consult Status: Follow-up Date: (LC offered to request and LC O/P for nect week and mom receptive / LC placed request ) Follow-up type: Out-patient    Matilde SprangMargaret Ann Ermias Tomeo 07/23/2018, 9:37 AM

## 2018-07-23 NOTE — Discharge Summary (Signed)
Obstetric Discharge Summary Reason for Admission: rupture of membranes Prenatal Procedures: none Intrapartum Procedures: spontaneous vaginal delivery Postpartum Procedures: none Complications-Operative and Postpartum: none Hemoglobin  Date Value Ref Range Status  07/22/2018 10.6 (L) 12.0 - 15.0 g/dL Final   HCT  Date Value Ref Range Status  07/22/2018 31.1 (L) 36.0 - 46.0 % Final    Physical Exam:  General: alert, cooperative and appears stated age 75Lochia: appropriate Uterine Fundus: firm Incision: healing well, no significant drainage, no dehiscence DVT Evaluation: No evidence of DVT seen on physical exam.  Discharge Diagnoses: Term Pregnancy-delivered  Discharge Information: Date: 07/23/2018 Activity: pelvic rest Diet: routine Medications: None Condition: improved Instructions: refer to practice specific booklet Discharge to: home   Newborn Data: Live born female  Birth Weight: 6 lb 14.9 oz (3144 g) APGAR: 9, 9  Newborn Delivery   Birth date/time:  07/21/2018 17:11:00 Delivery type:  Vaginal, Spontaneous     Home with mother.  Madison Daniels L 07/23/2018, 8:14 AM

## 2018-07-24 ENCOUNTER — Ambulatory Visit: Payer: Self-pay

## 2018-07-24 NOTE — Lactation Note (Signed)
This note was copied from a baby's chart. Lactation Consultation Note; Mom called for assist with latch. Mom has been supplementing with formula by #5 Fr feeding tube/syringe. Baby awake and showing feeding cues nursed for 10 min on left breast and Ped came in to check baby. Nursed another 5 min on that breast. Latched to right breast in football hold with assist from me. Mom states that feels very comfortable Suggested changing positions throughout the day to heap nipples to heal. Baby took 27 ml from feeding tube/syringe and off to sleep. Encouragement given. No questions at present. Reviewed engorgement prevention and treatment  Has Spectra DEBP for home. Reviewed our phone number, OP appointments and BFSg as resources for support after DC. To call prn  Patient Name: Madison Daniels Needsndrea Tingley ZOXWR'UToday's Date: 07/24/2018 Reason for consult: Follow-up assessment;Late-preterm 34-36.6wks   Maternal Data Formula Feeding for Exclusion: No Has patient been taught Hand Expression?: Yes Does the patient have breastfeeding experience prior to this delivery?: No  Feeding Feeding Type: Breast Fed  LATCH Score Latch: Grasps breast easily, tongue down, lips flanged, rhythmical sucking.  Audible Swallowing: A few with stimulation  Type of Nipple: Everted at rest and after stimulation  Comfort (Breast/Nipple): Filling, red/small blisters or bruises, mild/mod discomfort  Hold (Positioning): Assistance needed to correctly position infant at breast and maintain latch.  LATCH Score: 7  Interventions Interventions: Breast feeding basics reviewed;Hand express;Breast compression;Position options;Expressed milk  Lactation Tools Discussed/Used Tools: 62F feeding tube / Syringe WIC Program: No   Consult Status Consult Status: Complete    Pamelia HoitWeeks, Estuardo Frisbee D 07/24/2018, 9:39 AM

## 2018-07-25 LAB — TYPE AND SCREEN
ABO/RH(D): AB NEG
Antibody Screen: POSITIVE
UNIT DIVISION: 0
UNIT DIVISION: 0

## 2018-07-25 LAB — BPAM RBC
Blood Product Expiration Date: 201912162359
Blood Product Expiration Date: 201912162359
UNIT TYPE AND RH: 9500
Unit Type and Rh: 9500

## 2018-09-16 DIAGNOSIS — Z1389 Encounter for screening for other disorder: Secondary | ICD-10-CM | POA: Diagnosis not present

## 2019-02-09 DIAGNOSIS — R61 Generalized hyperhidrosis: Secondary | ICD-10-CM | POA: Diagnosis not present

## 2019-12-28 LAB — OB RESULTS CONSOLE ABO/RH: RH Type: NEGATIVE

## 2019-12-28 LAB — OB RESULTS CONSOLE RPR: RPR: NONREACTIVE

## 2019-12-28 LAB — OB RESULTS CONSOLE ANTIBODY SCREEN: Antibody Screen: NEGATIVE

## 2019-12-28 LAB — OB RESULTS CONSOLE GC/CHLAMYDIA
Chlamydia: NEGATIVE
Gonorrhea: NEGATIVE

## 2019-12-28 LAB — OB RESULTS CONSOLE HIV ANTIBODY (ROUTINE TESTING): HIV: NONREACTIVE

## 2019-12-28 LAB — OB RESULTS CONSOLE RUBELLA ANTIBODY, IGM: Rubella: IMMUNE

## 2019-12-28 LAB — OB RESULTS CONSOLE HEPATITIS B SURFACE ANTIGEN: Hepatitis B Surface Ag: NEGATIVE

## 2020-05-29 LAB — OB RESULTS CONSOLE HEPATITIS B SURFACE ANTIGEN: Hepatitis B Surface Ag: NEGATIVE

## 2020-05-29 LAB — OB RESULTS CONSOLE RUBELLA ANTIBODY, IGM: Rubella: IMMUNE

## 2020-05-29 LAB — OB RESULTS CONSOLE HIV ANTIBODY (ROUTINE TESTING): HIV: NONREACTIVE

## 2020-05-29 LAB — OB RESULTS CONSOLE RPR: RPR: NONREACTIVE

## 2020-06-15 LAB — OB RESULTS CONSOLE GC/CHLAMYDIA
Chlamydia: NEGATIVE
Gonorrhea: NEGATIVE

## 2020-09-08 NOTE — L&D Delivery Note (Signed)
Delivery Note At 9:02 PM a viable female was delivered via Vaginal, Spontaneous APGAR: 9 and 9 Placenta status: Spontaneous, Intact.  Cord: 3 vessels with the following complications: None.  Cord pH: not obtained  Anesthesia: Epidural Episiotomy:  none Lacerations: 2nd  Suture Repair: 3.0 chromic Est. Blood Loss (mL):  300  Mom to postpartum.  Baby to Couplet care / Skin to Skin.  [name] 12/16/2020, 9:15 PM

## 2020-12-03 LAB — OB RESULTS CONSOLE GBS: GBS: POSITIVE

## 2020-12-13 ENCOUNTER — Encounter (HOSPITAL_COMMUNITY): Payer: Self-pay | Admitting: *Deleted

## 2020-12-13 ENCOUNTER — Telehealth (HOSPITAL_COMMUNITY): Payer: Self-pay | Admitting: *Deleted

## 2020-12-13 NOTE — Telephone Encounter (Signed)
Preadmission screen  

## 2020-12-14 ENCOUNTER — Encounter (HOSPITAL_COMMUNITY): Payer: Self-pay | Admitting: *Deleted

## 2020-12-14 ENCOUNTER — Telehealth (HOSPITAL_COMMUNITY): Payer: Self-pay | Admitting: *Deleted

## 2020-12-14 NOTE — Telephone Encounter (Signed)
Preadmission screen  

## 2020-12-16 ENCOUNTER — Encounter (HOSPITAL_COMMUNITY): Payer: Self-pay | Admitting: Obstetrics and Gynecology

## 2020-12-16 ENCOUNTER — Other Ambulatory Visit: Payer: Self-pay

## 2020-12-16 ENCOUNTER — Inpatient Hospital Stay (HOSPITAL_COMMUNITY): Payer: Managed Care, Other (non HMO) | Admitting: Anesthesiology

## 2020-12-16 ENCOUNTER — Inpatient Hospital Stay (HOSPITAL_COMMUNITY)
Admission: AD | Admit: 2020-12-16 | Discharge: 2020-12-18 | DRG: 807 | Disposition: A | Discharge status: Home / Self Care | Payer: Managed Care, Other (non HMO) | Attending: Obstetrics and Gynecology | Admitting: Obstetrics and Gynecology

## 2020-12-16 DIAGNOSIS — Z20822 Contact with and (suspected) exposure to covid-19: Secondary | ICD-10-CM | POA: Diagnosis present

## 2020-12-16 DIAGNOSIS — Z3A37 37 weeks gestation of pregnancy: Secondary | ICD-10-CM

## 2020-12-16 DIAGNOSIS — O99824 Streptococcus B carrier state complicating childbirth: Principal | ICD-10-CM | POA: Diagnosis present

## 2020-12-16 DIAGNOSIS — O26893 Other specified pregnancy related conditions, third trimester: Secondary | ICD-10-CM | POA: Diagnosis present

## 2020-12-16 LAB — TYPE AND SCREEN
ABO/RH(D): AB NEG
Antibody Screen: POSITIVE

## 2020-12-16 LAB — CBC
HCT: 34.6 % — ABNORMAL LOW (ref 36.0–46.0)
Hemoglobin: 11.7 g/dL — ABNORMAL LOW (ref 12.0–15.0)
MCH: 30.1 pg (ref 26.0–34.0)
MCHC: 33.8 g/dL (ref 30.0–36.0)
MCV: 88.9 fL (ref 80.0–100.0)
Platelets: 167 10*3/uL (ref 150–400)
RBC: 3.89 MIL/uL (ref 3.87–5.11)
RDW: 13.9 % (ref 11.5–15.5)
WBC: 11.6 10*3/uL — ABNORMAL HIGH (ref 4.0–10.5)
nRBC: 0 % (ref 0.0–0.2)

## 2020-12-16 LAB — RESP PANEL BY RT-PCR (FLU A&B, COVID) ARPGX2
Influenza A by PCR: NEGATIVE
Influenza B by PCR: NEGATIVE
SARS Coronavirus 2 by RT PCR: NEGATIVE

## 2020-12-16 LAB — POCT FERN TEST: POCT Fern Test: POSITIVE

## 2020-12-16 MED ORDER — WITCH HAZEL-GLYCERIN EX PADS
1.0000 "application " | MEDICATED_PAD | CUTANEOUS | Status: DC | PRN
  Administered 2020-12-17: 1 via TOPICAL

## 2020-12-16 MED ORDER — FENTANYL-BUPIVACAINE-NACL 0.5-0.125-0.9 MG/250ML-% EP SOLN
EPIDURAL | Status: DC | PRN
  Administered 2020-12-16: 12 mL/h via EPIDURAL

## 2020-12-16 MED ORDER — TERBUTALINE SULFATE 1 MG/ML IJ SOLN: 0.2500 mg | Freq: Once | INTRAMUSCULAR | Status: DC | PRN

## 2020-12-16 MED ORDER — ACETAMINOPHEN 325 MG PO TABS: 650.0000 mg | ORAL_TABLET | ORAL | Status: DC | PRN

## 2020-12-16 MED ORDER — ACETAMINOPHEN 500 MG PO TABS
1000.0000 mg | ORAL_TABLET | Freq: Once | ORAL | Status: AC
  Administered 2020-12-16: 1000 mg via ORAL
  Filled 2020-12-16: qty 2

## 2020-12-16 MED ORDER — DIBUCAINE (PERIANAL) 1 % EX OINT
1.0000 "application " | TOPICAL_OINTMENT | CUTANEOUS | Status: DC | PRN
  Administered 2020-12-17: 1 via RECTAL
  Filled 2020-12-16: qty 28

## 2020-12-16 MED ORDER — OXYTOCIN-SODIUM CHLORIDE 30-0.9 UT/500ML-% IV SOLN
1.0000 m[IU]/min | INTRAVENOUS | Status: DC
  Administered 2020-12-16: 2 m[IU]/min via INTRAVENOUS
  Administered 2020-12-16: 6 m[IU]/min via INTRAVENOUS
  Administered 2020-12-16: 4 m[IU]/min via INTRAVENOUS
  Filled 2020-12-16: qty 500

## 2020-12-16 MED ORDER — LIDOCAINE HCL (PF) 1 % IJ SOLN
INTRAMUSCULAR | Status: DC | PRN
  Administered 2020-12-16: 10 mL via EPIDURAL
  Administered 2020-12-16: 2 mL via EPIDURAL

## 2020-12-16 MED ORDER — PHENYLEPHRINE 40 MCG/ML (10ML) SYRINGE FOR IV PUSH (FOR BLOOD PRESSURE SUPPORT)
80.0000 ug | PREFILLED_SYRINGE | INTRAVENOUS | Status: DC | PRN
  Filled 2020-12-16: qty 10

## 2020-12-16 MED ORDER — FLEET ENEMA 7-19 GM/118ML RE ENEM: 1.0000 | ENEMA | Freq: Every day | RECTAL | Status: DC | PRN

## 2020-12-16 MED ORDER — LACTATED RINGERS IV SOLN
500.0000 mL | Freq: Once | INTRAVENOUS | Status: AC
  Administered 2020-12-16: 500 mL via INTRAVENOUS

## 2020-12-16 MED ORDER — COCONUT OIL OIL: 1.0000 "application " | TOPICAL_OIL | Status: DC | PRN

## 2020-12-16 MED ORDER — PRENATAL MULTIVITAMIN CH
1.0000 | ORAL_TABLET | Freq: Every day | ORAL | Status: DC
  Administered 2020-12-17 – 2020-12-18 (×2): 1 via ORAL
  Filled 2020-12-16 (×2): qty 1

## 2020-12-16 MED ORDER — LACTATED RINGERS IV SOLN
500.0000 mL | INTRAVENOUS | Status: DC | PRN
  Administered 2020-12-16: 500 mL via INTRAVENOUS

## 2020-12-16 MED ORDER — ZOLPIDEM TARTRATE 5 MG PO TABS: 5.0000 mg | ORAL_TABLET | Freq: Every evening | ORAL | Status: DC | PRN

## 2020-12-16 MED ORDER — BENZOCAINE-MENTHOL 20-0.5 % EX AERO
1.0000 "application " | INHALATION_SPRAY | CUTANEOUS | Status: DC | PRN
  Administered 2020-12-17: 1 via TOPICAL
  Filled 2020-12-16: qty 56

## 2020-12-16 MED ORDER — ACETAMINOPHEN 325 MG PO TABS
650.0000 mg | ORAL_TABLET | ORAL | Status: DC | PRN
  Administered 2020-12-17 – 2020-12-18 (×2): 650 mg via ORAL
  Filled 2020-12-16 (×2): qty 2

## 2020-12-16 MED ORDER — BISACODYL 10 MG RE SUPP: 10.0000 mg | Freq: Every day | RECTAL | Status: DC | PRN

## 2020-12-16 MED ORDER — ONDANSETRON HCL 4 MG/2ML IJ SOLN: 4.0000 mg | INTRAMUSCULAR | Status: DC | PRN

## 2020-12-16 MED ORDER — FENTANYL-BUPIVACAINE-NACL 0.5-0.125-0.9 MG/250ML-% EP SOLN
12.0000 mL/h | EPIDURAL | Status: DC | PRN
  Filled 2020-12-16: qty 250

## 2020-12-16 MED ORDER — TETANUS-DIPHTH-ACELL PERTUSSIS 5-2.5-18.5 LF-MCG/0.5 IM SUSY: 0.5000 mL | PREFILLED_SYRINGE | Freq: Once | INTRAMUSCULAR | Status: DC

## 2020-12-16 MED ORDER — LACTATED RINGERS IV SOLN: INTRAVENOUS | Status: DC

## 2020-12-16 MED ORDER — FLEET ENEMA 7-19 GM/118ML RE ENEM: 1.0000 | ENEMA | RECTAL | Status: DC | PRN

## 2020-12-16 MED ORDER — ONDANSETRON HCL 4 MG/2ML IJ SOLN: 4.0000 mg | Freq: Four times a day (QID) | INTRAMUSCULAR | Status: DC | PRN

## 2020-12-16 MED ORDER — ONDANSETRON HCL 4 MG PO TABS: 4.0000 mg | ORAL_TABLET | ORAL | Status: DC | PRN

## 2020-12-16 MED ORDER — PHENYLEPHRINE 40 MCG/ML (10ML) SYRINGE FOR IV PUSH (FOR BLOOD PRESSURE SUPPORT): 80.0000 ug | PREFILLED_SYRINGE | INTRAVENOUS | Status: DC | PRN

## 2020-12-16 MED ORDER — SIMETHICONE 80 MG PO CHEW: 80.0000 mg | CHEWABLE_TABLET | ORAL | Status: DC | PRN

## 2020-12-16 MED ORDER — IBUPROFEN 600 MG PO TABS
600.0000 mg | ORAL_TABLET | Freq: Four times a day (QID) | ORAL | Status: DC
  Administered 2020-12-17 – 2020-12-18 (×7): 600 mg via ORAL
  Filled 2020-12-16 (×7): qty 1

## 2020-12-16 MED ORDER — OXYTOCIN-SODIUM CHLORIDE 30-0.9 UT/500ML-% IV SOLN: 2.5000 [IU]/h | INTRAVENOUS | Status: DC

## 2020-12-16 MED ORDER — OXYCODONE-ACETAMINOPHEN 5-325 MG PO TABS: 1.0000 | ORAL_TABLET | ORAL | Status: DC | PRN

## 2020-12-16 MED ORDER — OXYCODONE HCL 5 MG PO TABS: 5.0000 mg | ORAL_TABLET | ORAL | Status: DC | PRN

## 2020-12-16 MED ORDER — OXYTOCIN BOLUS FROM INFUSION
333.0000 mL | Freq: Once | INTRAVENOUS | Status: AC
  Administered 2020-12-16: 333 mL via INTRAVENOUS

## 2020-12-16 MED ORDER — OXYCODONE HCL 5 MG PO TABS: 10.0000 mg | ORAL_TABLET | ORAL | Status: DC | PRN

## 2020-12-16 MED ORDER — SODIUM CHLORIDE 0.9 % IV SOLN
2.0000 g | Freq: Four times a day (QID) | INTRAVENOUS | Status: DC
  Administered 2020-12-16: 2 g via INTRAVENOUS
  Filled 2020-12-16: qty 2000

## 2020-12-16 MED ORDER — MEDROXYPROGESTERONE ACETATE 150 MG/ML IM SUSP: 150.0000 mg | INTRAMUSCULAR | Status: DC | PRN

## 2020-12-16 MED ORDER — MEASLES, MUMPS & RUBELLA VAC IJ SOLR: 0.5000 mL | Freq: Once | INTRAMUSCULAR | Status: DC

## 2020-12-16 MED ORDER — OXYCODONE-ACETAMINOPHEN 5-325 MG PO TABS: 2.0000 | ORAL_TABLET | ORAL | Status: DC | PRN

## 2020-12-16 MED ORDER — EPHEDRINE 5 MG/ML INJ: 10.0000 mg | INTRAVENOUS | Status: DC | PRN

## 2020-12-16 MED ORDER — SOD CITRATE-CITRIC ACID 500-334 MG/5ML PO SOLN: 30.0000 mL | ORAL | Status: DC | PRN

## 2020-12-16 MED ORDER — DIPHENHYDRAMINE HCL 50 MG/ML IJ SOLN: 12.5000 mg | INTRAMUSCULAR | Status: DC | PRN

## 2020-12-16 MED ORDER — SENNOSIDES-DOCUSATE SODIUM 8.6-50 MG PO TABS
2.0000 | ORAL_TABLET | Freq: Every day | ORAL | Status: DC
  Administered 2020-12-17 – 2020-12-18 (×2): 2 via ORAL
  Filled 2020-12-16 (×2): qty 2

## 2020-12-16 MED ORDER — LIDOCAINE HCL (PF) 1 % IJ SOLN: 30.0000 mL | INTRAMUSCULAR | Status: DC | PRN

## 2020-12-16 MED ORDER — DIPHENHYDRAMINE HCL 25 MG PO CAPS: 25.0000 mg | ORAL_CAPSULE | Freq: Four times a day (QID) | ORAL | Status: DC | PRN

## 2020-12-16 NOTE — H&P (Signed)
Madison Daniels is a 40 y.o. G 4 P 0121 at 67 w 3 days presents with SROM and contractions Status post epidural and now on pitocin On Ampicillin for GBBS + OB History    Gravida  4   Para  1   Term      Preterm  1   AB  2   Living  1     SAB  2   IAB      Ectopic      Multiple  0   Live Births  1          History reviewed. No pertinent past medical history. Past Surgical History:  Procedure Laterality Date  . WISDOM TOOTH EXTRACTION Bilateral    Family History: family history is not on file. Social History:  reports that she has never smoked. She has never used smokeless tobacco. She reports previous alcohol use. She reports previous drug use.     Maternal Diabetes: No Genetic Screening: Normal Maternal Ultrasounds/Referrals: Normal Fetal Ultrasounds or other Referrals:  None Maternal Substance Abuse:  No Significant Maternal Medications:  None Significant Maternal Lab Results:  Group B Strep positive Other Comments:  None  Review of Systems  All other systems reviewed and are negative.  Maternal Medical History:  Reason for admission: Rupture of membranes and contractions.     Dilation: 4.5 Effacement (%): 80 Station: -2 Exam by:: Dr. Vincente Poli Blood pressure 112/61, pulse 80, temperature 98.2 F (36.8 C), temperature source Oral, resp. rate 18, height 5' 2.75" (1.594 m), weight 83.9 kg, SpO2 100 %, unknown if currently breastfeeding. Maternal Exam:  Uterine Assessment: Contraction strength is moderate.  Contraction frequency is irregular.   Abdomen: Fetal presentation: vertex     Fetal Exam Fetal State Assessment: Category I - tracings are normal.     Physical Exam Vitals and nursing note reviewed. Exam conducted with a chaperone present.  Constitutional:      Appearance: Normal appearance.  HENT:     Head: Normocephalic.  Eyes:     Pupils: Pupils are equal, round, and reactive to light.  Cardiovascular:     Rate and Rhythm: Regular  rhythm. Tachycardia present.  Musculoskeletal:     Cervical back: Normal range of motion.  Neurological:     Mental Status: She is alert.     Prenatal labs: ABO, Rh: --/--/AB NEG (04/10 1312) Antibody: POS (04/10 1312) Rubella: Immune (09/21 0000) RPR: Nonreactive (09/21 0000)  HBsAg: Negative (09/21 0000)  HIV: Non-reactive (09/21 0000)  GBS: Positive/-- (03/28 0000)   Assessment/Plan: IUP at 37 w 3 days SROM Labor GBBS + Anticipate nsvd Continue pitocin and ampicillin  Madison Daniels 12/16/2020, 5:24 PM

## 2020-12-16 NOTE — MAU Note (Signed)
Pt reports to mau with c/o possible SROM at 0945 this morning.  Pt reports some irregular ctx.  +FM

## 2020-12-16 NOTE — Anesthesia Preprocedure Evaluation (Signed)
Anesthesia Evaluation  Patient identified by MRN, date of birth, ID band Patient awake    Reviewed: Allergy & Precautions, Patient's Chart, lab work & pertinent test results  Airway Mallampati: II  TM Distance: >3 FB Neck ROM: Full    Dental no notable dental hx.    Pulmonary neg pulmonary ROS,    Pulmonary exam normal breath sounds clear to auscultation       Cardiovascular negative cardio ROS Normal cardiovascular exam Rhythm:Regular Rate:Normal     Neuro/Psych negative neurological ROS  negative psych ROS   GI/Hepatic negative GI ROS, Neg liver ROS,   Endo/Other  Obesity BMI 33  Renal/GU negative Renal ROS  negative genitourinary   Musculoskeletal negative musculoskeletal ROS (+)   Abdominal   Peds negative pediatric ROS (+)  Hematology negative hematology ROS (+) hct 34.6, plt 167   Anesthesia Other Findings   Reproductive/Obstetrics (+) Pregnancy                             Anesthesia Physical Anesthesia Plan  ASA: II and emergent  Anesthesia Plan: Epidural   Post-op Pain Management:    Induction:   PONV Risk Score and Plan: 2  Airway Management Planned: Natural Airway  Additional Equipment: None  Intra-op Plan:   Post-operative Plan:   Informed Consent: I have reviewed the patients History and Physical, chart, labs and discussed the procedure including the risks, benefits and alternatives for the proposed anesthesia with the patient or authorized representative who has indicated his/her understanding and acceptance.       Plan Discussed with:   Anesthesia Plan Comments:         Anesthesia Quick Evaluation

## 2020-12-16 NOTE — Anesthesia Procedure Notes (Signed)
Epidural Patient location during procedure: OB Start time: 12/16/2020 4:30 PM End time: 12/16/2020 4:38 PM  Staffing Anesthesiologist: Lannie Fields, DO Performed: anesthesiologist   Preanesthetic Checklist Completed: patient identified, IV checked, risks and benefits discussed, monitors and equipment checked, pre-op evaluation and timeout performed  Epidural Patient position: sitting Prep: DuraPrep and site prepped and draped Patient monitoring: continuous pulse ox, blood pressure, heart rate and cardiac monitor Approach: midline Location: L3-L4 Injection technique: LOR air  Needle:  Needle type: Tuohy  Needle gauge: 17 G Needle length: 9 cm Needle insertion depth: 6 cm Catheter type: closed end flexible Catheter size: 19 Gauge Catheter at skin depth: 11 cm Test dose: negative  Assessment Sensory level: T8 Events: blood not aspirated, injection not painful, no injection resistance, no paresthesia and negative IV test  Additional Notes Patient identified. Risks/Benefits/Options discussed with patient including but not limited to bleeding, infection, nerve damage, paralysis, failed block, incomplete pain control, headache, blood pressure changes, nausea, vomiting, reactions to medication both or allergic, itching and postpartum back pain. Confirmed with bedside nurse the patient's most recent platelet count. Confirmed with patient that they are not currently taking any anticoagulation, have any bleeding history or any family history of bleeding disorders. Patient expressed understanding and wished to proceed. All questions were answered. Sterile technique was used throughout the entire procedure. Please see nursing notes for vital signs. Test dose was given through epidural catheter and negative prior to continuing to dose epidural or start infusion. Warning signs of high block given to the patient including shortness of breath, tingling/numbness in hands, complete motor  block, or any concerning symptoms with instructions to call for help. Patient was given instructions on fall risk and not to get out of bed. All questions and concerns addressed with instructions to call with any issues or inadequate analgesia.  Reason for block:procedure for pain

## 2020-12-16 NOTE — Lactation Note (Signed)
This note was copied from a baby's chart. Lactation Consultation Note  Patient Name: Madison Daniels Today's Date: 12/16/2020 Reason for consult: L&D Initial assessment;Early term 37-38.6wks Age:39 hours  Mom sitting in bed holding sleeping baby, female at bedside. Mom states she is not breastfeeding, now plans to bottle feed only. Mom encouraged to call for [facility] support if desires in the future. Mom voiced understanding and with no further concerns. BGilliam, RN, IBCLC   Feeding Mother's Current Feeding Choice: Formula   Consult Status Consult Status: PRN    [name] 12/16/2020, 9:50 PM

## 2020-12-17 LAB — CBC
HCT: 29.2 % — ABNORMAL LOW (ref 36.0–46.0)
Hemoglobin: 10 g/dL — ABNORMAL LOW (ref 12.0–15.0)
MCH: 31 pg (ref 26.0–34.0)
MCHC: 34.2 g/dL (ref 30.0–36.0)
MCV: 90.4 fL (ref 80.0–100.0)
Platelets: 151 10*3/uL (ref 150–400)
RBC: 3.23 MIL/uL — ABNORMAL LOW (ref 3.87–5.11)
RDW: 13.7 % (ref 11.5–15.5)
WBC: 15.5 10*3/uL — ABNORMAL HIGH (ref 4.0–10.5)
nRBC: 0.1 % (ref 0.0–0.2)

## 2020-12-17 MED ORDER — RHO D IMMUNE GLOBULIN 1500 UNIT/2ML IJ SOSY
300.0000 ug | PREFILLED_SYRINGE | Freq: Once | INTRAMUSCULAR | Status: AC
  Administered 2020-12-17: 300 ug via INTRAVENOUS
  Filled 2020-12-17: qty 2

## 2020-12-17 NOTE — Social Work (Signed)
CSW received consult for hx of postpartum depression. CSW met with MOB to offer support and complete assessment.    CSW met with the MOB at bedside. CSW introduced role and congratulated MOB. CSW observed MOB getting back into the bed and infant sleeping in bassinet MOB. CSW explain the reason for the visit. MOB pleasant and receptive to the visit. CSW assessed MOB emotionally. MOB reports, I feel fine." CSW asked MOB about her pregnancy. MOB reports her pregnancy was not fun but fine.  MOB denies previous diagnosis of anxiety and depression. MOB denies using medications and therapy. CSW asked MOB about her history of postpartum depression. MOB reports she experienced postpartum depression, but it was undiagnosed. CSW asked MOB to explain her symptoms.  MOB reports she had mood swings and around the time of her menstrual cycle and recall feeling very low for about two weeks. MOB reports she managed and just got better on her own. MOB reports at the time she probably should have reached out to her doctor. CSW encouraged MOB to reach out to medical provider this time, if she experienced symptoms at any time. CSW recommended MOB complete a self-evaluation during the postpartum time period using the New Mom Checklist from Postpartum Progress. MOB receptive to the check list.   CSW assessed MOB for safety, MOB denies thoughts of harm to self and others. MOB reports no domestic violence concerns. CSW provided education regarding the baby blues period vs. perinatal mood disorders, discussed treatment and gave resources for mental health follow up if concerns arise. MOB was receptive to the resources. CSW provided review of Sudden Infant Death Syndrome (SIDS) precautions and informed MOB no co-sleeping with the infant. MOB reports she has all essential items for the infant, including a bassinet and car seat. MOB has chosen Dr. Corinna Lines at [facility]. CSW assessed MOB for additional needs. MOB reports no  further need.   CSW identifies no further need for intervention and no barriers to discharge at this time.  Kathrin Greathouse, MSW, LCSW [facility] [place] Worker  [phone] 12/17/2020  10:16 AM

## 2020-12-17 NOTE — Anesthesia Postprocedure Evaluation (Signed)
Anesthesia Post Note  Patient: Madison Daniels  Procedure(s) Performed: AN AD HOC LABOR EPIDURAL     Patient location during evaluation: Mother Baby Anesthesia Type: Epidural Level of consciousness: awake, awake and alert and oriented Pain management: pain level controlled Vital Signs Assessment: post-procedure vital signs reviewed and stable Respiratory status: spontaneous breathing, nonlabored ventilation and respiratory function stable Cardiovascular status: stable Postop Assessment: no headache, patient able to bend at knees, no apparent nausea or vomiting, no backache, adequate PO intake and able to ambulate Anesthetic complications: no   No complications documented.  Last Vitals:  Vitals:   12/17/20 0515 12/17/20 0808  BP: 114/81 118/86  Pulse: 79 76  Resp: 18 18  Temp: 36.6 C 36.7 C  SpO2:  99%    Last Pain:  Vitals:   12/17/20 0808  TempSrc: Oral  PainSc: 0-No pain   Pain Goal:                   Dorsel Flinn

## 2020-12-17 NOTE — Progress Notes (Signed)
Post Partum Day 1 Subjective: no complaints, up ad lib, voiding, tolerating PO and + flatus  Objective: Blood pressure 114/81, pulse 79, temperature 97.8 F (36.6 C), temperature source Oral, resp. rate 18, height 5' 2.75" (1.594 m), weight 83.9 kg, SpO2 100 %, unknown if currently breastfeeding.  Physical Exam:  General: alert, cooperative and no distress Lochia: appropriate Uterine Fundus: firm Incision: healing well DVT Evaluation: No evidence of DVT seen on physical exam.  Recent Labs    12/16/20 1318 12/17/20 0447  HGB 11.7* 10.0*  HCT 34.6* 29.2*    Assessment/Plan: Plan for discharge tomorrow D/W circumcision of newborn boy. Risks and instructions reviewed. She states she understands and agrees.  LOS: 1 day   Roselle Locus II 12/17/2020, 7:21 AM

## 2020-12-18 LAB — RH IG WORKUP (INCLUDES ABO/RH)
ABO/RH(D): AB NEG
Fetal Screen: NEGATIVE
Gestational Age(Wks): 37
Unit division: 0

## 2020-12-18 MED ORDER — IBUPROFEN 600 MG PO TABS: 600.0000 mg | ORAL_TABLET | Freq: Four times a day (QID) | ORAL | 0 refills | Status: AC

## 2020-12-18 MED ORDER — ACETAMINOPHEN 325 MG PO TABS: 650.0000 mg | ORAL_TABLET | ORAL | 0 refills | Status: AC | PRN

## 2020-12-18 NOTE — Progress Notes (Signed)
Postpartum Progress Note  Post Partum Day 2 s/p spontaneous vaginal delivery.  Patient reports well-controlled pain, ambulating without difficulty, voiding spontaneously, tolerating PO.  Vaginal bleeding is appropriate.   Objective: Blood pressure 125/80, pulse 87, temperature 98 F (36.7 C), temperature source Oral, resp. rate 18, height 5' 2.75" (1.594 m), weight 83.9 kg, SpO2 99 %, unknown if currently breastfeeding.  Physical Exam:  General: alert and no distress Lochia: appropriate Uterine Fundus: firm DVT Evaluation: No evidence of DVT seen on physical exam.  Recent Labs    12/16/20 1318 12/17/20 0447  HGB 11.7* 10.0*  HCT 34.6* 29.2*    Assessment/Plan: . Postpartum Day 2, s/p vaginal delivery. . Continue routine postpartum care . Lactation following . Circ for baby delayed yesterday, will perform today if able. . Anticipate discharge home today   LOS: 2 days   Lyn Henri 12/18/2020, 7:32 AM

## 2020-12-19 LAB — RPR: RPR Ser Ql: NONREACTIVE

## 2020-12-19 NOTE — Discharge Summary (Signed)
Obstetric Discharge Summary  Madison Daniels is a 40 y.o. female that presented on 12/16/2020 for SROM at [redacted]w[redacted]d.  She was admitted to labor and delivery for labor.  Her labor course was uncomplicated and she delivered a viable female infant on 12/16/20.  Her postpartum course was uncomplicated and on PPD#2, she reported well controlled pain, spontaneous voiding, ambulating without difficulty, and tolerating PO.  She was stable for discharge home on 12/18/20 with plans for in-office follow up.  Hemoglobin  Date Value Ref Range Status  12/17/2020 10.0 (L) 12.0 - 15.0 g/dL Final   HCT  Date Value Ref Range Status  12/17/2020 29.2 (L) 36.0 - 46.0 % Final    Physical Exam:  General: alert and no distress Lochia: appropriate Uterine Fundus: firm DVT Evaluation: No evidence of DVT seen on physical exam.  Discharge Diagnoses: Term Pregnancy-delivered  Discharge Information: Date: 12/19/2020 Activity: pelvic rest Diet: routine Medications: Ibuprofen Condition: stable Instructions: refer to practice specific booklet Discharge to: home  Follow-up Information    , [facility] Follow up.   Why: Please follow up for 6-week postpartum visit.  Contact information: [address] [place] [address] [phone]               Newborn Data: Live born female  Birth Weight: 8 lb 9.2 oz (3890 g) APGAR: 9, 9  Newborn Delivery   Birth date/time: 12/16/2020 21:02:00 Delivery type: Vaginal, Spontaneous      Home with mother.  Lyn Henri 12/19/2020, 9:44 PM

## 2020-12-25 ENCOUNTER — Other Ambulatory Visit (HOSPITAL_COMMUNITY): Payer: Managed Care, Other (non HMO) | Attending: Obstetrics and Gynecology

## 2020-12-27 ENCOUNTER — Inpatient Hospital Stay (HOSPITAL_COMMUNITY)
Admission: AD | Admit: 2020-12-27 | Payer: Managed Care, Other (non HMO) | Source: Home / Self Care | Admitting: Obstetrics and Gynecology

## 2020-12-27 ENCOUNTER — Inpatient Hospital Stay (HOSPITAL_COMMUNITY): Payer: Managed Care, Other (non HMO)

## 2021-01-03 ENCOUNTER — Inpatient Hospital Stay (HOSPITAL_COMMUNITY): Admit: 2021-01-03 | Payer: Self-pay
